# Patient Record
Sex: Male | Born: 1975 | Race: Black or African American | Hispanic: No | Marital: Single | Smoking: Former smoker
Health system: Southern US, Community
[De-identification: ages and names within clinical notes are randomized; demographics above are authoritative.]

## PROBLEM LIST (undated history)

## (undated) ENCOUNTER — Emergency Department (HOSPITAL_BASED_OUTPATIENT_CLINIC_OR_DEPARTMENT_OTHER): Admission: EM | Payer: Managed Care, Other (non HMO) | Source: Home / Self Care

## (undated) DIAGNOSIS — F419 Anxiety disorder, unspecified: Secondary | ICD-10-CM

---

## 2019-04-25 ENCOUNTER — Other Ambulatory Visit: Payer: Self-pay

## 2019-04-25 DIAGNOSIS — Z20822 Contact with and (suspected) exposure to covid-19: Secondary | ICD-10-CM

## 2019-04-26 LAB — NOVEL CORONAVIRUS, NAA: SARS-CoV-2, NAA: NOT DETECTED

## 2019-08-10 ENCOUNTER — Other Ambulatory Visit: Payer: Self-pay

## 2019-08-10 DIAGNOSIS — Z20822 Contact with and (suspected) exposure to covid-19: Secondary | ICD-10-CM

## 2019-08-12 LAB — NOVEL CORONAVIRUS, NAA: SARS-CoV-2, NAA: NOT DETECTED

## 2020-01-28 ENCOUNTER — Other Ambulatory Visit: Payer: Self-pay | Admitting: Family Medicine

## 2020-01-28 ENCOUNTER — Other Ambulatory Visit: Payer: Self-pay

## 2020-01-28 ENCOUNTER — Ambulatory Visit: Payer: Self-pay

## 2020-01-28 DIAGNOSIS — Z0000 Encounter for general adult medical examination without abnormal findings: Secondary | ICD-10-CM

## 2021-04-07 ENCOUNTER — Encounter (HOSPITAL_COMMUNITY): Payer: Self-pay

## 2021-04-07 ENCOUNTER — Emergency Department (HOSPITAL_BASED_OUTPATIENT_CLINIC_OR_DEPARTMENT_OTHER): Payer: 59 | Admitting: Radiology

## 2021-04-07 ENCOUNTER — Encounter (HOSPITAL_BASED_OUTPATIENT_CLINIC_OR_DEPARTMENT_OTHER): Payer: Self-pay | Admitting: Emergency Medicine

## 2021-04-07 ENCOUNTER — Emergency Department (HOSPITAL_BASED_OUTPATIENT_CLINIC_OR_DEPARTMENT_OTHER)
Admission: EM | Admit: 2021-04-07 | Discharge: 2021-04-07 | Disposition: A | Discharge status: Home / Self Care | Payer: 59 | Attending: Emergency Medicine | Admitting: Emergency Medicine

## 2021-04-07 ENCOUNTER — Emergency Department (HOSPITAL_BASED_OUTPATIENT_CLINIC_OR_DEPARTMENT_OTHER): Payer: 59

## 2021-04-07 ENCOUNTER — Other Ambulatory Visit: Payer: Self-pay

## 2021-04-07 ENCOUNTER — Ambulatory Visit (HOSPITAL_COMMUNITY)
Admission: EM | Admit: 2021-04-07 | Discharge: 2021-04-07 | Disposition: A | Discharge status: Home / Self Care | Payer: 59 | Attending: Internal Medicine | Admitting: Internal Medicine

## 2021-04-07 DIAGNOSIS — R0600 Dyspnea, unspecified: Secondary | ICD-10-CM

## 2021-04-07 DIAGNOSIS — R079 Chest pain, unspecified: Secondary | ICD-10-CM | POA: Diagnosis not present

## 2021-04-07 DIAGNOSIS — Z7982 Long term (current) use of aspirin: Secondary | ICD-10-CM | POA: Diagnosis not present

## 2021-04-07 DIAGNOSIS — R0602 Shortness of breath: Secondary | ICD-10-CM

## 2021-04-07 DIAGNOSIS — Z20822 Contact with and (suspected) exposure to covid-19: Secondary | ICD-10-CM | POA: Diagnosis not present

## 2021-04-07 DIAGNOSIS — Z87891 Personal history of nicotine dependence: Secondary | ICD-10-CM | POA: Diagnosis not present

## 2021-04-07 DIAGNOSIS — R0789 Other chest pain: Secondary | ICD-10-CM | POA: Diagnosis present

## 2021-04-07 DIAGNOSIS — R091 Pleurisy: Secondary | ICD-10-CM

## 2021-04-07 DIAGNOSIS — R072 Precordial pain: Secondary | ICD-10-CM

## 2021-04-07 HISTORY — DX: Anxiety disorder, unspecified: F41.9

## 2021-04-07 LAB — BASIC METABOLIC PANEL
Anion gap: 12 (ref 5–15)
BUN: 8 mg/dL (ref 6–20)
CO2: 22 mmol/L (ref 22–32)
Calcium: 9.3 mg/dL (ref 8.9–10.3)
Chloride: 102 mmol/L (ref 98–111)
Creatinine, Ser: 0.85 mg/dL (ref 0.61–1.24)
GFR, Estimated: >60 mL/min (ref >60)
Glucose, Bld: 121 mg/dL — ABNORMAL HIGH (ref 70–99)
Potassium: 4 mmol/L (ref 3.5–5.1)
Sodium: 136 mmol/L (ref 135–145)

## 2021-04-07 LAB — CBC
HCT: 43.4 % (ref 39.0–52.0)
Hemoglobin: 14.2 g/dL (ref 13.0–17.0)
MCH: 25.1 pg — ABNORMAL LOW (ref 26.0–34.0)
MCHC: 32.7 g/dL (ref 30.0–36.0)
MCV: 76.7 fL — ABNORMAL LOW (ref 80.0–100.0)
Platelets: 279 10*3/uL (ref 150–400)
RBC: 5.66 MIL/uL (ref 4.22–5.81)
RDW: 17.3 % — ABNORMAL HIGH (ref 11.5–15.5)
WBC: 6.7 10*3/uL (ref 4.0–10.5)
nRBC: 0.3 % — ABNORMAL HIGH (ref 0.0–0.2)

## 2021-04-07 LAB — RESP PANEL BY RT-PCR (FLU A&B, COVID) ARPGX2
Influenza A by PCR: NEGATIVE
Influenza B by PCR: NEGATIVE
SARS Coronavirus 2 by RT PCR: NEGATIVE

## 2021-04-07 LAB — HEPATIC FUNCTION PANEL
ALT: 39 U/L (ref 0–44)
AST: 43 U/L — ABNORMAL HIGH (ref 15–41)
Albumin: 4.4 g/dL (ref 3.5–5.0)
Alkaline Phosphatase: 52 U/L (ref 38–126)
Bilirubin, Direct: 0.2 mg/dL (ref 0.0–0.2)
Indirect Bilirubin: 0.6 mg/dL (ref 0.3–0.9)
Total Bilirubin: 0.8 mg/dL (ref 0.3–1.2)
Total Protein: 6.7 g/dL (ref 6.5–8.1)

## 2021-04-07 LAB — LIPASE, BLOOD: Lipase: 35 U/L (ref 11–51)

## 2021-04-07 LAB — TROPONIN I (HIGH SENSITIVITY): Troponin I (High Sensitivity): 3 ng/L (ref <18)

## 2021-04-07 MED ORDER — PANTOPRAZOLE SODIUM 20 MG PO TBEC: 20.0000 mg | DELAYED_RELEASE_TABLET | Freq: Every day | ORAL | 0 refills | Status: DC

## 2021-04-07 MED ORDER — SODIUM CHLORIDE 0.9 % IV SOLN: Freq: Once | INTRAVENOUS | Status: AC

## 2021-04-07 MED ORDER — IOHEXOL 350 MG/ML SOLN
75.0000 mL | Freq: Once | INTRAVENOUS | Status: AC | PRN
  Administered 2021-04-07: 75 mL via INTRAVENOUS

## 2021-04-07 MED ORDER — PANTOPRAZOLE SODIUM 40 MG IV SOLR
40.0000 mg | Freq: Once | INTRAVENOUS | Status: AC
  Administered 2021-04-07: 40 mg via INTRAVENOUS
  Filled 2021-04-07: qty 40

## 2021-04-07 MED ORDER — ASPIRIN EC 81 MG PO TBEC: 81.0000 mg | DELAYED_RELEASE_TABLET | Freq: Every day | ORAL | 1 refills | Status: DC

## 2021-04-07 NOTE — ED Provider Notes (Signed)
[facility]    CSN: [id number] Arrival date & time: 04/07/21  1146      History   Chief Complaint Chief Complaint  Patient presents with   Shortness of Breath   Chest Pain    HPI Russell Dawson is a 45 y.o. male.   Patient presents today with a 3 to 4-day history of substernal chest pain.  Reports pain has been constant since onset and is rated 5/6 on a 0-10 pain scale, described as a discomfort, worse with ascending stairs or wearing a mask, no alleviating factors identified.  He reports associated shortness of breath but denies nausea, vomiting, diaphoresis, dizziness, syncope, radiating pain.  He denies history of heart disease or previous episodes of similar symptoms.  He is a former smoker but quit with onset of symptoms.  He denies history of diabetes, hypertension, hyperlipidemia.  He does have a family history of heart disease in his mother.  He has a history of anxiety and does have a history of panic attacks but states typically the symptoms resolve very quickly after removing himself from a stressful situation current symptoms have been persistent since onset.  He has not tried any over-the-counter medication for symptom management.  He does not have a primary care provider.  He is unsure if shortness of breath is related to chest pain or something else as he does have a history of snoring and has been waking up recently with his mouth being very dry.  He denies formal diagnosis of sleep apnea but is interested in establishing with a primary care provider now that he has insurance to investigate his health.    Past Medical History:  Diagnosis Date   Anxiety     There are no problems to display for this patient.   History reviewed. No pertinent surgical history.     Home Medications    Prior to Admission medications   Not on File    Family History Family History  Problem Relation Age of Onset   Heart failure Mother    Diabetes Mother    Tuberculosis  Mother    Dementia Father     Social History Social History   Tobacco Use   Smoking status: Former    Types: Cigarettes    Quit date: 04/03/2014    Years since quitting: 7.0   Smokeless tobacco: Never  Vaping Use   Vaping Use: Never used  Substance Use Topics   Alcohol use: Yes   Drug use: Never     Allergies   Patient has no known allergies.   Review of Systems Review of Systems  Constitutional:  Positive for fatigue. Negative for activity change, appetite change and fever.  Eyes:  Negative for visual disturbance.  Respiratory:  Positive for shortness of breath. Negative for cough.   Cardiovascular:  Positive for chest pain. Negative for palpitations and leg swelling.  Gastrointestinal:  Negative for abdominal pain, diarrhea, nausea and vomiting.  Neurological:  Negative for dizziness, weakness, light-headedness, numbness and headaches.    Physical Exam Triage Vital Signs ED Triage Vitals  Enc Vitals Group     BP 04/07/21 1206 133/82     Pulse Rate 04/07/21 1206 95     Resp 04/07/21 1206 16     Temp 04/07/21 1206 98.5 F (36.9 C)     Temp Source 04/07/21 1206 Oral     SpO2 04/07/21 1206 98 %     Weight --      Height --  Head Circumference --      Peak Flow --      Pain Score 04/07/21 1212 0     Pain Loc --      Pain Edu? --      Excl. in GC? --    No data found.  Updated Vital Signs BP 133/82 (BP Location: Right Arm)   Pulse 95   Temp 98.5 F (36.9 C) (Oral)   Resp 16   SpO2 98%   Visual Acuity Right Eye Distance:   Left Eye Distance:   Bilateral Distance:    Right Eye Near:   Left Eye Near:    Bilateral Near:     Physical Exam Vitals reviewed.  Constitutional:      General: He is awake.     Appearance: Normal appearance. He is normal weight. He is not ill-appearing.     Comments: Very pleasant male appears stated age in no acute distress sitting comfortably in exam room  HENT:     Head: Normocephalic and atraumatic.   Cardiovascular:     Rate and Rhythm: Normal rate and regular rhythm.     Heart sounds: Normal heart sounds, S1 normal and S2 normal. No murmur heard. Pulmonary:     Effort: Pulmonary effort is normal.     Breath sounds: Normal breath sounds. No stridor. No wheezing, rhonchi or rales.     Comments: Clear to auscultation bilaterally Chest:     Chest wall: No deformity, swelling or tenderness.  Abdominal:     General: Bowel sounds are normal.     Palpations: Abdomen is soft.     Tenderness: There is no abdominal tenderness.  Musculoskeletal:     Cervical back: Normal range of motion and neck supple.  Neurological:     Mental Status: He is alert.  Psychiatric:        Behavior: Behavior is cooperative.     UC Treatments / Results  Labs (all labs ordered are listed, but only abnormal results are displayed) Labs Reviewed - No data to display  EKG   Radiology No results found.  Procedures Procedures (including critical care time)  Medications Ordered in UC Medications - No data to display  Initial Impression / Assessment and Plan / UC Course  I have reviewed the triage vital signs and the nursing notes.  Pertinent labs & imaging results that were available during my care of the patient were reviewed by me and considered in my medical decision making (see chart for details).      EKG obtained showed normal sinus rhythm with ventricular rate of 100 bpm without ischemic changes; no previous to compare.  Vital signs are appropriate today.  Given chest pain and associated shortness of breath discussed the patient would need to go to the emergency room for further evaluation and management to consider troponins to rule out cardiac etiology.  Patient was reluctant but ultimately agreed to go to the emergency room for further evaluation.  He declined EMS and vital signs were stable at the time of discharge so patient is safe for private transport.  He will go directly to emergency  room following visit for further evaluation and management.  Final Clinical Impressions(s) / UC Diagnoses   Final diagnoses:  Chest pain, unspecified type  Shortness of breath     Discharge Instructions      Please go to the hospital for further evaluation and to consider lab work to monitor your heart since you are having both chest  pain and shortness of breath.  If you have any sudden worsening of your chest pain or feel you are going to pass out while going to the hospital he should stop and call 911.     ED Prescriptions   None    PDMP not reviewed this encounter.   Jeani Hawking, PA-C 04/07/21 1305

## 2021-04-07 NOTE — ED Triage Notes (Signed)
Patient presents to Urgent Care with complaints of SOB and chest pain x 3-4 days ago. He states he has a hx of anxiety does not take medications for. He states he has been under stress with mother being hospitalized.

## 2021-04-07 NOTE — ED Provider Notes (Signed)
[facility] EMERGENCY DEPT Provider Note   CSN: [id number] Arrival date & time: 04/07/21  1413     History Chief Complaint  Patient presents with   Chest Pain    Russell Dawson is a 45 y.o. male.  HPI Patient is sent from urgent care for further evaluation of chest pain.  Patient reports has had a central chest pain.  It is aching in quality.  He reports it is somewhat worse with deep inspiration and with exertion.  He reports he does feel somewhat short of breath and like he has to work a little harder to take a deep breath patient reports he first experienced this 4 days ago.  Reports prior to that he had no symptoms.  Patient reports that he did have a recent car trip to [place].  He drove about 6 and half hours.  Reports he did stop for bathroom breaks.  He reports that the chest pain started just about after his trip.  No fever no chills.  No productive cough or hemoptysis.  No syncope or near syncope.  Family history: Patient's mother recently diagnosed with congestive heart failure.  No history of sudden death or coronary artery disease at early age.  Patient does not have any siblings.  Patient reports he was previously a smoker but quit 4 days ago when the symptoms started.  He denies any other drug use. HPI: A 45 year old patient presents for evaluation of chest pain. Initial onset of pain was more than 6 hours ago. The patient's chest pain is described as heaviness/pressure/tightness and is worse with exertion. The patient's chest pain is not middle- or left-sided, is not well-localized, is not sharp and does not radiate to the arms/jaw/neck. The patient does not complain of nausea and denies diaphoresis. The patient has smoked in the past 90 days. The patient has no history of stroke, has no history of peripheral artery disease, denies any history of treated diabetes, has no relevant family history of coronary artery disease (first degree relative at less than age 29), is  not hypertensive, has no history of hypercholesterolemia and does not have an elevated BMI (>=30).   Past Medical History:  Diagnosis Date   Anxiety     There are no problems to display for this patient.   History reviewed. No pertinent surgical history.     Family History  Problem Relation Age of Onset   Heart failure Mother    Diabetes Mother    Tuberculosis Mother    Dementia Father     Social History   Tobacco Use   Smoking status: Former    Types: Cigarettes    Quit date: 04/03/2014    Years since quitting: 7.0   Smokeless tobacco: Never  Vaping Use   Vaping Use: Never used  Substance Use Topics   Alcohol use: Yes   Drug use: Never    Home Medications Prior to Admission medications   Medication Sig Start Date End Date Taking? Authorizing Provider  aspirin EC 81 MG tablet Take 1 tablet (81 mg total) by mouth daily. Swallow whole. 04/07/21  Yes Arby Barrette, MD  pantoprazole (PROTONIX) 20 MG tablet Take 1 tablet (20 mg total) by mouth daily. 04/07/21  Yes Arby Barrette, MD    Allergies    Patient has no known allergies.  Review of Systems   Review of Systems 10 systems reviewed and negative except as per HPI Physical Exam Updated Vital Signs BP 119/85   Pulse 88  Temp 97.7 F (36.5 C) (Oral)   Resp 20   Ht 5\' 11"  (1.803 m)   Wt 95.7 kg   SpO2 100%   BMI 29.43 kg/m   Physical Exam Constitutional:      Appearance: Normal appearance.  HENT:     Mouth/Throat:     Mouth: Mucous membranes are moist.     Pharynx: Oropharynx is clear.  Eyes:     Extraocular Movements: Extraocular movements intact.  Cardiovascular:     Rate and Rhythm: Normal rate and regular rhythm.  Pulmonary:     Effort: Pulmonary effort is normal.     Breath sounds: Normal breath sounds.  Abdominal:     General: There is no distension.     Palpations: Abdomen is soft.     Tenderness: There is no abdominal tenderness. There is no guarding.  Musculoskeletal:         General: No swelling or tenderness. Normal range of motion.     Right lower leg: No edema.     Left lower leg: No edema.  Skin:    General: Skin is warm and dry.  Neurological:     General: No focal deficit present.     Mental Status: He is alert and oriented to person, place, and time.     Motor: No weakness.     Coordination: Coordination normal.  Psychiatric:        Mood and Affect: Mood normal.    ED Results / Procedures / Treatments   Labs (all labs ordered are listed, but only abnormal results are displayed) Labs Reviewed  BASIC METABOLIC PANEL - Abnormal; Notable for the following components:      Result Value   Glucose, Bld 121 (*)    All other components within normal limits  CBC - Abnormal; Notable for the following components:   MCV 76.7 (*)    MCH 25.1 (*)    RDW 17.3 (*)    nRBC 0.3 (*)    All other components within normal limits  HEPATIC FUNCTION PANEL - Abnormal; Notable for the following components:   AST 43 (*)    All other components within normal limits  RESP PANEL BY RT-PCR (FLU A&B, COVID) ARPGX2  LIPASE, BLOOD  TROPONIN I (HIGH SENSITIVITY)    EKG EKG Interpretation  Date/Time:  Friday April 07 2021 14:21:37 EDT Ventricular Rate:  90 PR Interval:  142 QRS Duration: 92 QT Interval:  350 QTC Calculation: 428 R Axis:   4 Text Interpretation: Normal sinus rhythm Minimal voltage criteria for LVH, may be normal variant ( R in aVL ) Nonspecific T wave abnormality Abnormal ECG agree, no old comparison Confirmed by 07-10-1994 [phone]) on 04/07/2021 6:30:46 PM  Radiology DG Chest 2 View  Result Date: 04/07/2021 CLINICAL DATA:  Chest pain and short of breath EXAM: CHEST - 2 VIEW COMPARISON:  01/28/2020 FINDINGS: The heart size and mediastinal contours are within normal limits. Both lungs are clear. The visualized skeletal structures are unremarkable. IMPRESSION: No active cardiopulmonary disease. Electronically Signed   By: 03/29/2020 M.D.   On:  04/07/2021 15:05   CT Angio Chest PE W/Cm &/Or Wo Cm  Result Date: 04/07/2021 CLINICAL DATA:  Concern for pulmonary embolism. EXAM: CT ANGIOGRAPHY CHEST WITH CONTRAST TECHNIQUE: Multidetector CT imaging of the chest was performed using the standard protocol during bolus administration of intravenous contrast. Multiplanar CT image reconstructions and MIPs were obtained to evaluate the vascular anatomy. CONTRAST:  52mL OMNIPAQUE IOHEXOL 350 MG/ML  SOLN COMPARISON:  None. FINDINGS: Cardiovascular: No filling defects within the pulmonary arteries to suggest acute pulmonary embolism. Mediastinum/Nodes: No axillary or supraclavicular adenopathy. No mediastinal or hilar adenopathy. No pericardial fluid. Esophagus normal. Lungs/Pleura: No pulmonary infarction. No pneumonia. No pleural fluid or pneumothorax. Upper Abdomen: Limited view of the liver, kidneys, pancreas are unremarkable. Normal adrenal glands. Musculoskeletal: No aggressive osseous lesion Review of the MIP images confirms the above findings. Electronically Signed   By: Genevive Bi M.D.   On: 04/07/2021 19:40    Procedures Procedures   Medications Ordered in ED Medications  0.9 %  sodium chloride infusion ( Intravenous New Bag/Given 04/07/21 1914)  iohexol (OMNIPAQUE) 350 MG/ML injection 75 mL (75 mLs Intravenous Contrast Given 04/07/21 1919)  pantoprazole (PROTONIX) injection 40 mg (40 mg Intravenous Given 04/07/21 1953)    ED Course  I have reviewed the triage vital signs and the nursing notes.  Pertinent labs & imaging results that were available during my care of the patient were reviewed by me and considered in my medical decision making (see chart for details).     MDM Rules/Calculators/A&P HEAR Score: 3                        Patient presents as outlined.  He did have a recent car trip with onset of pleuritic quality chest pain just after that event.  CT scan has ruled out pulmonary embolus.  Patient does not have hypoxia COVID  is negative.  Patient's heart score is 3.  Troponin negative.  At this time, plan will be for initiating Protonix and daily enteric-coated aspirin.  Patient is counseled to follow-up with cardiology.  Strict return precautions provided.  Patient will also be getting established with PCP. Final Clinical Impression(s) / ED Diagnoses Final diagnoses:  Precordial pain  Dyspnea, unspecified type  Pleurisy    Rx / DC Orders ED Discharge Orders          Ordered    pantoprazole (PROTONIX) 20 MG tablet  Daily        04/07/21 2011    aspirin EC 81 MG tablet  Daily        04/07/21 2011             Arby Barrette, MD 04/07/21 2022

## 2021-04-07 NOTE — ED Notes (Signed)
Patient is being discharged from the Urgent Care and sent to the Emergency Department via POV . Per Dorann Ou PA, patient is in need of higher level of care due to Chest Pain and SOB. Patient is aware and verbalizes understanding of plan of care.  Vitals:   04/07/21 1206  BP: 133/82  Pulse: 95  Resp: 16  Temp: 98.5 F (36.9 C)  SpO2: 98%

## 2021-04-07 NOTE — ED Notes (Signed)
Patient transported to CT 

## 2021-04-07 NOTE — Discharge Instructions (Addendum)
Please go to the hospital for further evaluation and to consider lab work to monitor your heart since you are having both chest pain and shortness of breath.  If you have any sudden worsening of your chest pain or feel you are going to pass out while going to the hospital he should stop and call 911.

## 2021-04-07 NOTE — ED Triage Notes (Signed)
Chest tightness and SOB x 4 days. Sent from UC.

## 2021-04-07 NOTE — Discharge Instructions (Addendum)
1.  Call [facility] for a follow-up appointment as soon as possible.  You should also have a family doctor.  Use the referral number your discharge instructions to help find a physician. 2.  Take Protonix daily as prescribed.  Also take a 81 mg aspirin daily as prescribed until you have been seen in follow-up for further evaluation and testing. 3.  You had quit smoking recently.  That is very helpful for your health and trying to minimize your risk factors for heart disease.  Do not start smoking again. 4.  Return to the emergency department immediately if you have recurrence of pain, other concerning symptoms such as lightheadedness, feeling to pass out unusual nausea, sweating fatigue or other concerning symptoms.

## 2021-04-07 NOTE — ED Notes (Signed)
Pt verbalizes understanding of discharge instructions. Opportunity for questioning and answers were provided. Armand removed by staff, pt discharged from ED to home. Educated to f/u with primary care.

## 2021-06-16 ENCOUNTER — Encounter (HOSPITAL_BASED_OUTPATIENT_CLINIC_OR_DEPARTMENT_OTHER): Payer: Self-pay | Admitting: *Deleted

## 2021-06-16 ENCOUNTER — Emergency Department (HOSPITAL_BASED_OUTPATIENT_CLINIC_OR_DEPARTMENT_OTHER)
Admission: EM | Admit: 2021-06-16 | Discharge: 2021-06-16 | Disposition: A | Discharge status: Home / Self Care | Payer: 59 | Attending: Emergency Medicine | Admitting: Emergency Medicine

## 2021-06-16 ENCOUNTER — Emergency Department (HOSPITAL_BASED_OUTPATIENT_CLINIC_OR_DEPARTMENT_OTHER): Payer: 59 | Admitting: Radiology

## 2021-06-16 ENCOUNTER — Other Ambulatory Visit: Payer: Self-pay

## 2021-06-16 DIAGNOSIS — M25551 Pain in right hip: Secondary | ICD-10-CM

## 2021-06-16 DIAGNOSIS — Z87891 Personal history of nicotine dependence: Secondary | ICD-10-CM | POA: Diagnosis not present

## 2021-06-16 DIAGNOSIS — M79604 Pain in right leg: Secondary | ICD-10-CM | POA: Diagnosis not present

## 2021-06-16 MED ORDER — IBUPROFEN 400 MG PO TABS
600.0000 mg | ORAL_TABLET | Freq: Once | ORAL | Status: AC
  Administered 2021-06-16: 600 mg via ORAL
  Filled 2021-06-16: qty 1

## 2021-06-16 MED ORDER — ACETAMINOPHEN 325 MG PO TABS
650.0000 mg | ORAL_TABLET | Freq: Once | ORAL | Status: AC
  Administered 2021-06-16: 325 mg via ORAL
  Filled 2021-06-16: qty 2

## 2021-06-16 NOTE — ED Notes (Signed)
Patient lying in bed states pain in right hurts. Rates a 7. Dr. Audley Hose aware medications ordered. Reviewed d/c papers with patient with understanding.

## 2021-06-16 NOTE — Discharge Instructions (Addendum)
Call your primary care doctor or specialist as discussed in the next 2-3 days.   Return immediately back to the ER if:  Your symptoms worsen within the next 12-24 hours. You develop new symptoms such as new fevers, persistent vomiting, new pain, shortness of breath, or new weakness or numbness, or if you have any other concerns.  

## 2021-06-16 NOTE — ED Provider Notes (Signed)
[facility] EMERGENC[facility]PT Provider Note   CSN: [id number] Arrival date & time: 06/16/21  1246     History Chief Complaint  Patient presents with   Hip Pain   Leg Pain    Russell Dawson is a 45 y.o. male.  Chief complaint of right hip pain.  Symptoms ongoing for 2 to 3 weeks.  Describes a sharp and aching pain in the right hip worse when he puts weight on it a certain way or turns on a certain way.  He denies any specific fall or trauma, does recall playing basketball about 3 weeks ago, but did not have immediate pain after playing.  Denies fevers or cough no vomiting or diarrhea pain radiates down his leg on the backside to his knee.      Past Medical History:  Diagnosis Date   Anxiety     There are no problems to display for this patient.   History reviewed. No pertinent surgical history.     Family History  Problem Relation Age of Onset   Heart failure Mother    Diabetes Mother    Tuberculosis Mother    Dementia Father     Social History   Tobacco Use   Smoking status: Former    Types: Cigarettes    Quit date: 04/03/2014    Years since quitting: 7.2   Smokeless tobacco: Never  Vaping Use   Vaping Use: Never used  Substance Use Topics   Alcohol use: Yes    Comment: occasionally   Drug use: Never    Home Medications Prior to Admission medications   Not on File    Allergies    Patient has no known allergies.  Review of Systems   Review of Systems  Constitutional:  Negative for fever.  HENT:  Negative for ear pain and sore throat.   Eyes:  Negative for pain.  Respiratory:  Negative for cough.   Cardiovascular:  Negative for chest pain.  Gastrointestinal:  Negative for abdominal pain.  Genitourinary:  Negative for flank pain.  Musculoskeletal:  Negative for back pain.  Skin:  Negative for color change and rash.  Neurological:  Negative for syncope.  All other systems reviewed and are negative.  Physical Exam Updated Vital  Signs BP (!) 143/100 (BP Location: Right Arm)   Pulse (!) 118   Temp 98.6 F (37 C)   Resp 16   Ht 5\' 11"  (1.803 m)   Wt 97.5 kg   SpO2 98%   BMI 29.99 kg/m   Physical Exam Constitutional:      Appearance: He is well-developed.  HENT:     Head: Normocephalic.     Nose: Nose normal.  Eyes:     Extraocular Movements: Extraocular movements intact.  Cardiovascular:     Rate and Rhythm: Normal rate.  Pulmonary:     Effort: Pulmonary effort is normal.  Musculoskeletal:     Right lower leg: No edema.     Left lower leg: No edema.     Comments: Right lower extremity normal range of motion of the hips and knees and ankle.  No pain elicited on internal rotation or external rotation of the hip or axial loading of the hip. Neurovascularly intact lower extremity distal pulses are 2+ dorsalis pedis and posterior tibialis.  Compartments are otherwise soft.  Patient has a nonantalgic gait on exam here.  Skin:    Coloration: Skin is not jaundiced.  Neurological:     Mental Status: He is alert.  Mental status is at baseline.    ED Results / Procedures / Treatments   Labs (all labs ordered are listed, but only abnormal results are displayed) Labs Reviewed - No data to display  EKG None  Radiology DG Hip Unilat W or Wo Pelvis 2-3 Views Right  Result Date: 06/16/2021 CLINICAL DATA:  Right hip and groin pain for 2 weeks EXAM: DG HIP (WITH OR WITHOUT PELVIS) 2-3V RIGHT COMPARISON:  None. FINDINGS: There is no evidence of hip fracture or dislocation. No significant arthropathy. No focal bony abnormality. No soft tissue abnormality. IMPRESSION: Negative. Electronically Signed   By: Duanne Guess D.O.   On: 06/16/2021 14:04    Procedures Procedures   Medications Ordered in ED Medications - No data to display  ED Course  I have reviewed the triage vital signs and the nursing notes.  Pertinent labs & imaging results that were available during my care of the patient were reviewed by  me and considered in my medical decision making (see chart for details).    MDM Rules/Calculators/A&P                           No clear cause of patient's pain elicited.  Differential included septic joint, fracture, dislocation among others.  X-rays are unremarkable.  Advising continue Tylenol and Motrin at home.  Advised outpatient follow-up with orthopedic surgery within the week.  Advised immediate return for fevers worsening pain or any additional concerns.  Final Clinical Impression(s) / ED Diagnoses Final diagnoses:  Hip pain, right    Rx / DC Orders ED Discharge Orders     None        Cheryll Cockayne, MD 06/16/21 1623

## 2021-06-16 NOTE — ED Triage Notes (Signed)
Right hip pain radiating to his right leg for 2 weeks.  Patient can not remember any injury.  He did remember that he was playing basketball 3 weeks ago.

## 2021-06-16 NOTE — ED Provider Notes (Signed)
Emergency Medicine Provider Triage Evaluation Note  Russell Dawson , a 45 y.o. male  was evaluated in triage.  Pt complains of pain to right hip.  Pain has been present over the last 2 weeks.  Pain is now radiating through posterior right thigh.  Patient denies any known injuries.  Denies any swelling, erythema, rash.  Review of Systems  Positive: Arthralgia, myalgia Negative: Fever, chills, swelling, rash  Physical Exam  BP (!) 143/100 (BP Location: Right Arm)   Pulse (!) 118   Temp 98.6 F (37 C)   Resp 16   SpO2 98%  Gen:   Awake, no distress   Resp:  Normal effort  MSK:   Moves extremities without difficulty, tenderness to right hip Other:  +2 DP pulse bilaterally  Medical Decision Making  Medically screening exam initiated at 1:11 PM.  Appropriate orders placed.  Lyn Deemer was informed that the remainder of the evaluation will be completed by another provider, this initial triage assessment does not replace that evaluation, and the importance of remaining in the ED until their evaluation is complete.  The patient appears stable so that the remainder of the work up may be completed by another provider.      Haskel Schroeder, PA-C 06/16/21 1312    Sloan Leiter, DO 06/16/21 1932

## 2021-06-20 ENCOUNTER — Ambulatory Visit: Payer: 59 | Admitting: Family Medicine

## 2021-06-20 ENCOUNTER — Ambulatory Visit: Payer: Self-pay

## 2021-06-20 ENCOUNTER — Encounter: Payer: Self-pay | Admitting: Family Medicine

## 2021-06-20 VITALS — Ht 71.0 in | Wt 215.0 lb

## 2021-06-20 DIAGNOSIS — M65959 Unspecified synovitis and tenosynovitis, unspecified thigh: Secondary | ICD-10-CM

## 2021-06-20 DIAGNOSIS — M659 Synovitis and tenosynovitis, unspecified: Secondary | ICD-10-CM

## 2021-06-20 DIAGNOSIS — M24159 Other articular cartilage disorders, unspecified hip: Secondary | ICD-10-CM

## 2021-06-20 HISTORY — DX: Synovitis and tenosynovitis, unspecified: M65.9

## 2021-06-20 HISTORY — DX: Unspecified synovitis and tenosynovitis, unspecified thigh: M65.959

## 2021-06-20 MED ORDER — TRIAMCINOLONE ACETONIDE 40 MG/ML IJ SUSP
40.0000 mg | Freq: Once | INTRAMUSCULAR | Status: AC
  Administered 2021-06-20: 40 mg via INTRA_ARTICULAR

## 2021-06-20 NOTE — Patient Instructions (Signed)
Nice to meet you ?Please try ice  ?Please try the exercises   ?Please send me a message in MyChart with any questions or updates.  ?Please see me back in 3 weeks.  ? ?--Dr. Dola Lunsford ? ?

## 2021-06-20 NOTE — Assessment & Plan Note (Signed)
Concern for labral irritation given no traumatic history and ongoing pain with normal imaging. -Counseled on home exercise therapy and supportive care. -Injection today. -Could consider physical therapy

## 2021-06-20 NOTE — Progress Notes (Signed)
  Russell Dawson - 45 y.o. male MRN [id number]  Date of birth: 11/17/1975  SUBJECTIVE:  Including CC & ROS.  No chief complaint on file.   Russell Dawson is a 45 y.o. male that is presenting with acute right hip pain.  Has been ongoing for about 4 weeks.  Is walking with a limp currently..  Independent review of the right hip x-ray from 9/23 shows no acute changes.   Review of Systems See HPI   HISTORY: Past Medical, Surgical, Social, and Family History Reviewed & Updated per EMR.   Pertinent Historical Findings include:  Past Medical History:  Diagnosis Date   Anxiety     History reviewed. No pertinent surgical history.  Family History  Problem Relation Age of Onset   Heart failure Mother    Diabetes Mother    Tuberculosis Mother    Dementia Father     Social History   Socioeconomic History   Marital status: Single    Spouse name: Not on file   Number of children: Not on file   Years of education: Not on file   Highest education level: Not on file  Occupational History   Not on file  Tobacco Use   Smoking status: Former    Types: Cigarettes    Quit date: 04/03/2014    Years since quitting: 7.2   Smokeless tobacco: Never  Vaping Use   Vaping Use: Never used  Substance and Sexual Activity   Alcohol use: Yes    Comment: occasionally   Drug use: Never   Sexual activity: Not on file  Other Topics Concern   Not on file  Social History Narrative   Not on file   Social Determinants of Health   Financial Resource Strain: Not on file  Food Insecurity: Not on file  Transportation Needs: Not on file  Physical Activity: Not on file  Stress: Not on file  Social Connections: Not on file  Intimate Partner Violence: Not on file     PHYSICAL EXAM:  VS: Ht 5\' 11"  (1.803 m)   Wt 215 lb (97.5 kg)   BMI 29.99 kg/m  Physical Exam Gen: NAD, alert, cooperative with exam, well-appearing    Aspiration/Injection Procedure Note Russell Dawson [birth year]  Procedure:  Injection Indications: Right hip pain  Procedure Details Consent: Risks of procedure as well as the alternatives and risks of each were explained to the (patient/caregiver).  Consent for procedure obtained. Time Out: Verified patient identification, verified procedure, site/side was marked, verified correct patient position, special equipment/implants available, medications/allergies/relevent history reviewed, required imaging and test results available.  Performed.  The area was cleaned with iodine and alcohol swabs.    The right hip joint was injected using 3 cc of 1% lidocaine on a 22-gauge 3-1/2 inch needle.  The syringe was switched and a mixture containing 1 cc's of 40 mg Kenalog and 4 cc's of 0.25% bupivacaine was injected.  Ultrasound was used. Images were obtained in long views showing the injection.     A sterile dressing was applied.  Patient did tolerate procedure well.      ASSESSMENT & PLAN:   Labral tear of hip, degenerative Concern for labral irritation given no traumatic history and ongoing pain with normal imaging. -Counseled on home exercise therapy and supportive care. -Injection today. -Could consider physical therapy

## 2021-06-26 ENCOUNTER — Ambulatory Visit: Payer: 59 | Admitting: Family Medicine

## 2021-06-26 VITALS — Ht 71.0 in | Wt 215.0 lb

## 2021-06-26 DIAGNOSIS — M24159 Other articular cartilage disorders, unspecified hip: Secondary | ICD-10-CM | POA: Diagnosis not present

## 2021-06-26 MED ORDER — BACLOFEN 10 MG PO TABS: 10.0000 mg | ORAL_TABLET | Freq: Three times a day (TID) | ORAL | 0 refills | Status: DC

## 2021-06-26 MED ORDER — PREDNISONE 5 MG PO TABS: ORAL_TABLET | ORAL | 0 refills | Status: DC

## 2021-06-26 MED ORDER — HYDROCODONE-ACETAMINOPHEN 5-325 MG PO TABS: 1.0000 | ORAL_TABLET | Freq: Three times a day (TID) | ORAL | 0 refills | Status: DC | PRN

## 2021-06-26 MED ORDER — MELOXICAM 7.5 MG PO TABS: 7.5000 mg | ORAL_TABLET | Freq: Two times a day (BID) | ORAL | 1 refills | Status: DC | PRN

## 2021-06-26 NOTE — Patient Instructions (Signed)
Good to see you Please stop the ibuprofen and try the mobic. You can use this for mild to moderate pain. Please start this after the prednisone  Please use the norco only for severe pain and can't be taken while driving or at work.  I will call with the lab results from today   Please send me a message in MyChart with any questions or updates.  Please see me back in 4 weeks.   --Dr. Jordan Likes

## 2021-06-26 NOTE — Progress Notes (Signed)
  Russell Dawson - 45 y.o. male MRN [id number]  Date of birth: 1975-12-05  SUBJECTIVE:  Including CC & ROS.  No chief complaint on file.   Russell Dawson is a 45 y.o. male that is presenting with acute worsening of his right hip pain.  He had relief with a steroid injection for about a day or 2.  Pain is severe in nature and has been ongoing for over a month.  Pain is worse with any weightbearing or ambulation.    Review of Systems See HPI   HISTORY: Past Medical, Surgical, Social, and Family History Reviewed & Updated per EMR.   Pertinent Historical Findings include:  Past Medical History:  Diagnosis Date   Anxiety     No past surgical history on file.  Family History  Problem Relation Age of Onset   Heart failure Mother    Diabetes Mother    Tuberculosis Mother    Dementia Father     Social History   Socioeconomic History   Marital status: Single    Spouse name: Not on file   Number of children: Not on file   Years of education: Not on file   Highest education level: Not on file  Occupational History   Not on file  Tobacco Use   Smoking status: Former    Types: Cigarettes    Quit date: 04/03/2014    Years since quitting: 7.2   Smokeless tobacco: Never  Vaping Use   Vaping Use: Never used  Substance and Sexual Activity   Alcohol use: Yes    Comment: occasionally   Drug use: Never   Sexual activity: Not on file  Other Topics Concern   Not on file  Social History Narrative   Not on file   Social Determinants of Health   Financial Resource Strain: Not on file  Food Insecurity: Not on file  Transportation Needs: Not on file  Physical Activity: Not on file  Stress: Not on file  Social Connections: Not on file  Intimate Partner Violence: Not on file     PHYSICAL EXAM:  VS: Ht 5\' 11"  (1.803 m)   Wt 215 lb (97.5 kg)   BMI 29.99 kg/m  Physical Exam Gen: NAD, alert, cooperative with exam, well-appearing      ASSESSMENT & PLAN:   Labral tear of hip,  degenerative Acutely worsening.  Still seems joint related as he did get improvement with the steroid injection but only limited improvement of his pain. Less likely for infection. -Counseled on home exercise therapy and supportive care. -Meloxicam. -Baclofen. -Norco. -Provided work note. -CMP, CBC, sed rate, CRP -May need to pursue further imaging if symptoms worsen.

## 2021-06-26 NOTE — Assessment & Plan Note (Signed)
Acutely worsening.  Still seems joint related as he did get improvement with the steroid injection but only limited improvement of his pain. Less likely for infection. -Counseled on home exercise therapy and supportive care. -Meloxicam. -Baclofen. -Norco. -Provided work note. -CMP, CBC, sed rate, CRP -May need to pursue further imaging if symptoms worsen.

## 2021-06-27 LAB — CBC WITH DIFFERENTIAL
Basophils Absolute: 0 10*3/uL (ref 0.0–0.2)
Basos: 1 %
EOS (ABSOLUTE): 0 10*3/uL (ref 0.0–0.4)
Eos: 1 %
Hematocrit: 39.4 % (ref 37.5–51.0)
Hemoglobin: 13 g/dL (ref 13.0–17.7)
Immature Grans (Abs): 0 10*3/uL (ref 0.0–0.1)
Immature Granulocytes: 1 %
Lymphocytes Absolute: 3 10*3/uL (ref 0.7–3.1)
Lymphs: 51 %
MCH: 25.3 pg — ABNORMAL LOW (ref 26.6–33.0)
MCHC: 33 g/dL (ref 31.5–35.7)
MCV: 77 fL — ABNORMAL LOW (ref 79–97)
Monocytes Absolute: 0.5 10*3/uL (ref 0.1–0.9)
Monocytes: 8 %
Neutrophils Absolute: 2.2 10*3/uL (ref 1.4–7.0)
Neutrophils: 38 %
RBC: 5.14 x10E6/uL (ref 4.14–5.80)
RDW: 16.2 % — ABNORMAL HIGH (ref 11.6–15.4)
WBC: 5.8 10*3/uL (ref 3.4–10.8)

## 2021-06-27 LAB — COMPREHENSIVE METABOLIC PANEL
ALT: 49 IU/L — ABNORMAL HIGH (ref 0–44)
AST: 43 IU/L — ABNORMAL HIGH (ref 0–40)
Albumin/Globulin Ratio: 2.4 — ABNORMAL HIGH (ref 1.2–2.2)
Albumin: 4.8 g/dL (ref 4.0–5.0)
Alkaline Phosphatase: 95 IU/L (ref 44–121)
BUN/Creatinine Ratio: 16 (ref 9–20)
BUN: 13 mg/dL (ref 6–24)
Bilirubin Total: 0.5 mg/dL (ref 0.0–1.2)
CO2: 19 mmol/L — ABNORMAL LOW (ref 20–29)
Calcium: 9.4 mg/dL (ref 8.7–10.2)
Chloride: 101 mmol/L (ref 96–106)
Creatinine, Ser: 0.79 mg/dL (ref 0.76–1.27)
Globulin, Total: 2 g/dL (ref 1.5–4.5)
Glucose: 90 mg/dL (ref 70–99)
Potassium: 4.3 mmol/L (ref 3.5–5.2)
Sodium: 142 mmol/L (ref 134–144)
Total Protein: 6.8 g/dL (ref 6.0–8.5)
eGFR: 114 mL/min/{1.73_m2} (ref >59)

## 2021-06-27 LAB — C-REACTIVE PROTEIN: CRP: 1 mg/L (ref 0–10)

## 2021-06-27 LAB — SEDIMENTATION RATE: Sed Rate: 2 mm/hr (ref 0–15)

## 2021-06-28 ENCOUNTER — Telehealth: Payer: Self-pay | Admitting: Family Medicine

## 2021-06-28 NOTE — Telephone Encounter (Signed)
Left VM for patient. If he calls back please have him speak with a nurse/CMA and inform that his labs show no sign of inflammatory or infection.  He does have elevated liver enzymes which we will have to keep an eye on.   If any questions then please take the best time and phone number to call and I will try to call him back.   Myra Rude, MD [facility] 06/28/2021, 5:05 PM

## 2021-07-12 ENCOUNTER — Ambulatory Visit: Payer: 59 | Admitting: Family Medicine

## 2021-07-12 NOTE — Telephone Encounter (Signed)
Pt informed of below.    He states he is in bed, not feeling well. He c/o night sweats and swollen lymph nodes around his neck/shoulder. He states he completed the prednisone which seemed to help temporarily. He stopped taking baclofen, meloxicam and hydroco/apap.  He states his symptoms are better controlled with Ibuprofen 800 mg and 1 tylenol 500 mg. He feels like this is a infection and is requesting antibiotics and possibly more ibuprofen 800 mg. He states he has to be back at work on 07/17/21.

## 2021-07-12 NOTE — Telephone Encounter (Signed)
Left VM for patient. If he calls back please have him speak with a nurse/CMA and ask if his pain is worse or changed at all. I'd try to schedule a virtual visit so we can discuss.   If any questions then please take the best time and phone number to call and I will try to call him back.   Myra Rude, MD [facility] 07/12/2021, 1:43 PM

## 2021-07-12 NOTE — Telephone Encounter (Signed)
Virtual OV scheduled 07/13/21.

## 2021-07-13 ENCOUNTER — Telehealth (INDEPENDENT_AMBULATORY_CARE_PROVIDER_SITE_OTHER): Payer: 59 | Admitting: Family Medicine

## 2021-07-13 DIAGNOSIS — M659 Synovitis and tenosynovitis, unspecified: Secondary | ICD-10-CM

## 2021-07-13 NOTE — Assessment & Plan Note (Signed)
Pain is severe and worsening. Xray and lab work has been unrevealing. Concern for infection vs inflammatory source given intensity of pain with no inciting event.  - counseled on supportive care  - MRI of hip to evaluate for joint infection

## 2021-07-13 NOTE — Progress Notes (Signed)
Virtual Visit via Video Note  I connected with Russell Dawson on 07/13/21 at  8:00 AM EDT by a video enabled telemedicine application and verified that I am speaking with the correct person using two identifiers.  Location: Patient: home Provider: office   I discussed the limitations of evaluation and management by telemedicine and the availability of in person appointments. The patient expressed understanding and agreed to proceed.  History of Present Illness:  Russell Dawson is a 45 yo M that is presenting with acute worsening of his right hip pain. He has been experiencing severe pain at the hip with night sweats and swollen lymph nodes.    Observations/Objective:   Assessment and Plan:  Synovitis of right hip:  Pain is severe and worsening. Xray and lab work has been unrevealing. Concern for infection vs inflammatory source given intensity of pain with no inciting event.  - counseled on supportive care  - MRI of hip to evaluate for joint infection   Follow Up Instructions:    I discussed the assessment and treatment plan with the patient. The patient was provided an opportunity to ask questions and all were answered. The patient agreed with the plan and demonstrated an understanding of the instructions.   The patient was advised to call back or seek an in-person evaluation if the symptoms worsen or if the condition fails to improve as anticipated.    Clare Gandy, MD

## 2021-07-24 ENCOUNTER — Ambulatory Visit: Payer: 59 | Admitting: Family Medicine

## 2021-09-14 ENCOUNTER — Encounter (HOSPITAL_COMMUNITY): Payer: Self-pay | Admitting: Emergency Medicine

## 2021-09-14 ENCOUNTER — Ambulatory Visit (HOSPITAL_COMMUNITY)
Admission: EM | Admit: 2021-09-14 | Discharge: 2021-09-14 | Disposition: A | Discharge status: Home / Self Care | Payer: Managed Care, Other (non HMO) | Attending: Emergency Medicine | Admitting: Emergency Medicine

## 2021-09-14 ENCOUNTER — Other Ambulatory Visit: Payer: Self-pay

## 2021-09-14 DIAGNOSIS — B9689 Other specified bacterial agents as the cause of diseases classified elsewhere: Secondary | ICD-10-CM

## 2021-09-14 DIAGNOSIS — H109 Unspecified conjunctivitis: Secondary | ICD-10-CM | POA: Diagnosis not present

## 2021-09-14 DIAGNOSIS — L731 Pseudofolliculitis barbae: Secondary | ICD-10-CM | POA: Diagnosis not present

## 2021-09-14 DIAGNOSIS — Z113 Encounter for screening for infections with a predominantly sexual mode of transmission: Secondary | ICD-10-CM | POA: Diagnosis not present

## 2021-09-14 MED ORDER — CEPHALEXIN 500 MG PO CAPS: 500.0000 mg | ORAL_CAPSULE | Freq: Two times a day (BID) | ORAL | 0 refills | Status: AC

## 2021-09-14 MED ORDER — POLYMYXIN B-TRIMETHOPRIM 10000-0.1 UNIT/ML-% OP SOLN: 1.0000 [drp] | OPHTHALMIC | 0 refills | Status: DC

## 2021-09-14 NOTE — Discharge Instructions (Addendum)
Today you being treated for bacterial conjunctivitis.   Place one drop of polytrim into the effected eye every 4 hours while awake for 7 days. If the other eye starts to have symptoms you may use medication in it as well. Do not allow tip of dropper to touch eye. May use cool compress for comfort and to remove discharge if present. Pat the eye, do not wipe.  Do not rub eyes, this may cause more irritation.  May use benadryl as needed to help if itching present.  If symptoms persist after use of medication, please follow up at Urgent Care or with ophthalmologist (eye doctor)   Take keflex twice a day for 5 day to cover for infection   Hold warm-hot compresses to affected area at least 4 times a day, this helps to facilitate draining, the more the better  Please return for evaluation for increased swelling, increased tenderness or pain, non healing site, non draining site, you begin to have fever or chills   We reviewed the etiology of recurrent abscesses of skin.  Skin abscesses are collections of pus within the dermis and deeper skin tissues. Skin abscesses manifest as painful, tender, fluctuant, and erythematous nodules, frequently surmounted by a pustule and surrounded by a rim of erythematous swelling.  Spontaneous drainage of purulent material may occur.  Fever can occur on occasion.    -Skin abscesses can develop in healthy individuals with no predisposing conditions other than skin or nasal carriage of Staphylococcus aureus.  Individuals in close contact with others who have active infection with skin abscesses are at increased risk which is likely to explain why twin brother has similar episodes.   In addition, any process leading to a breach in the skin barrier can also predispose to the development of a skin abscesses, such as atopic dermatitis.     Labs pending 2-3 days, you will be contacted if positive for any sti and treatment will be sent to the pharmacy, you will have to return to  the clinic if positive for gonorrhea to receive treatment   Please refrain from having sex until labs results, if positive please refrain from having sex until treatment complete and symptoms resolve   If positive for Chlamydia  gonorrhea or trichomoniasis please notify partner or partners so they may tested as well  Moving forward, it is recommended you use some form of protection against the transmission of sti infections  such as condoms or dental dams with each sexual encounter

## 2021-09-14 NOTE — ED Provider Notes (Signed)
[facility]    CSN: [id number] Arrival date & time: 09/14/21  1012      History   Chief Complaint Chief Complaint  Patient presents with   Conjunctivitis   Nasal Congestion    HPI Rayvon Dakin is a 45 y.o. male.   Patient presents with bilateral eye redness and drainage for 7 days. Symptoms started in left eye and initially had swelling which has resolved. Has attempted use of erythromycin ointment which has not helped. Denies blurred vision, floaters, light sensitivity.  Had a cyst that was draining.in right groin, possibly transferred infection. Took doxycyline for the cyst, which has resolved.   Endorses bumps in genital region specifically on the upper pelvis where hair is present. Lesions intermittently drains milky Heli Dino pus. Denies pain, fever, chills, penile discharge, urinary changes. Concerned for STD. No known exposure. 1 partner, no condom use.    Past Medical History:  Diagnosis Date   Anxiety     Patient Active Problem List   Diagnosis Date Noted   Synovitis of hip 06/20/2021    History reviewed. No pertinent surgical history.     Home Medications    Prior to Admission medications   Medication Sig Start Date End Date Taking? Authorizing Provider  baclofen (LIORESAL) 10 MG tablet Take 1 tablet (10 mg total) by mouth 3 (three) times daily. 06/26/21   Myra Rude, MD  HYDROcodone-acetaminophen (NORCO/VICODIN) 5-325 MG tablet Take 1 tablet by mouth every 8 (eight) hours as needed. 06/26/21   Myra Rude, MD  meloxicam (MOBIC) 7.5 MG tablet Take 1 tablet (7.5 mg total) by mouth 2 (two) times daily as needed. 06/26/21   Myra Rude, MD  predniSONE (DELTASONE) 5 MG tablet Take 6 pills for first day, 5 pills second day, 4 pills third day, 3 pills fourth day, 2 pills the fifth day, and 1 pill sixth day. 06/26/21   Myra Rude, MD    Family History Family History  Problem Relation Age of Onset   Heart failure Mother    Diabetes  Mother    Tuberculosis Mother    Dementia Father     Social History Social History   Tobacco Use   Smoking status: Former    Types: Cigarettes    Quit date: 04/03/2014    Years since quitting: 7.4   Smokeless tobacco: Never  Vaping Use   Vaping Use: Never used  Substance Use Topics   Alcohol use: Yes    Comment: occasionally   Drug use: Never     Allergies   Patient has no known allergies.   Review of Systems Review of Systems  Constitutional: Negative.   HENT: Negative.    Eyes:  Positive for discharge and redness. Negative for photophobia, pain, itching and visual disturbance.  Respiratory: Negative.    Cardiovascular: Negative.   Gastrointestinal: Negative.   Genitourinary:  Positive for genital sores. Negative for decreased urine volume, difficulty urinating, dysuria, enuresis, flank pain, frequency, hematuria, penile discharge, penile pain, penile swelling, scrotal swelling, testicular pain and urgency.  Skin: Negative.   Neurological: Negative.     Physical Exam Triage Vital Signs ED Triage Vitals  Enc Vitals Group     BP 09/14/21 1030 (!) 145/97     Pulse Rate 09/14/21 1030 (!) 119     Resp 09/14/21 1030 18     Temp 09/14/21 1030 98.7 F (37.1 C)     Temp Source 09/14/21 1030 Oral     SpO2 09/14/21  1030 98 %     Weight --      Height --      Head Circumference --      Peak Flow --      Pain Score 09/14/21 1028 0     Pain Loc --      Pain Edu? --      Excl. in [place]? --    No data found.  Updated Vital Signs BP (!) 145/97    Pulse (!) 119    Temp 98.7 F (37.1 C) (Oral)    Resp 18    SpO2 98%   Visual Acuity Right Eye Distance:   Left Eye Distance:   Bilateral Distance:    Right Eye Near:   Left Eye Near:    Bilateral Near:     Physical Exam Constitutional:      Appearance: Normal appearance. He is normal weight.  HENT:     Head: Normocephalic.  Eyes:     Extraocular Movements: Extraocular movements intact.     Comments: Bilateral  erythema of the conjunctivae, no swelling, drainage noted. Vision intact, extraocular movement intact   Pulmonary:     Effort: Pulmonary effort is normal.  Genitourinary:      Comments: 3-4 ingrown hair present, non tender, non drainage, site of abscess has healed,  Skin:    General: Skin is warm and dry.  Neurological:     General: No focal deficit present.     Mental Status: He is alert and oriented to person, place, and time. Mental status is at baseline.  Psychiatric:        Mood and Affect: Mood normal.        Behavior: Behavior normal.     UC Treatments / Results  Labs (all labs ordered are listed, but only abnormal results are displayed) Labs Reviewed - No data to display  EKG   Radiology No results found.  Procedures Procedures (including critical care time)  Medications Ordered in UC Medications - No data to display  Initial Impression / Assessment and Plan / UC Course  I have reviewed the triage vital signs and the nursing notes.  Pertinent labs & imaging results that were available during my care of the patient were reviewed by me and considered in my medical decision making (see chart for details).  Bacterial conjunctivitis of both eyes Ingrown hair Routine screening for STI  Discussed with patient low suspicion of cross-contamination from infection of groin cyst, will change medications for treatment of bacterial conjunctivitis, Polytrim prescribed for 7 days use, recommended follow-up with ophthalmology if symptoms continue to persist, advise cool compresses for comfort and oral antihistamines as needed for itching, patient does not wear contacts Genital lesions appear to be ingrown hairs without signs of infection, recommended daily exfoliation with use of textured washcloth and circular motions, will cover for any bacteria,  prescribed Keflex for 5 days, low suspicions of etiology being herpes, discussed with patient STI screening pending, will treat per  protocol, advised abstinence until lab results and/or treatment is complete, recommended condom use during all sexual encounters moving forward Final Clinical Impressions(s) / UC Diagnoses   Final diagnoses:  None   Discharge Instructions   None    ED Prescriptions   None    PDMP not reviewed this encounter.   Hans Eden, NP 09/14/21 [phone]

## 2021-09-14 NOTE — ED Triage Notes (Signed)
Pt is present today with bilateral eye irritation/redness, nasal congestion, and sneezing. Pt states the eye irritation started one week ago and the nasal congestion and sneezing started yesterday

## 2021-09-15 LAB — CYTOLOGY, (ORAL, ANAL, URETHRAL) ANCILLARY ONLY
Chlamydia: NEGATIVE
Comment: NEGATIVE
Comment: NEGATIVE
Comment: NORMAL
Neisseria Gonorrhea: NEGATIVE
Trichomonas: NEGATIVE

## 2022-03-01 ENCOUNTER — Emergency Department (HOSPITAL_BASED_OUTPATIENT_CLINIC_OR_DEPARTMENT_OTHER)
Admission: EM | Admit: 2022-03-01 | Discharge: 2022-03-01 | Disposition: A | Discharge status: Home / Self Care | Payer: Managed Care, Other (non HMO) | Attending: Emergency Medicine | Admitting: Emergency Medicine

## 2022-03-01 ENCOUNTER — Encounter (HOSPITAL_BASED_OUTPATIENT_CLINIC_OR_DEPARTMENT_OTHER): Payer: Self-pay | Admitting: Emergency Medicine

## 2022-03-01 ENCOUNTER — Other Ambulatory Visit: Payer: Self-pay

## 2022-03-01 DIAGNOSIS — R531 Weakness: Secondary | ICD-10-CM | POA: Insufficient documentation

## 2022-03-01 DIAGNOSIS — R5383 Other fatigue: Secondary | ICD-10-CM | POA: Insufficient documentation

## 2022-03-01 DIAGNOSIS — R42 Dizziness and giddiness: Secondary | ICD-10-CM | POA: Insufficient documentation

## 2022-03-01 LAB — CBC
HCT: 43.9 % (ref 39.0–52.0)
Hemoglobin: 14.2 g/dL (ref 13.0–17.0)
MCH: 24.7 pg — ABNORMAL LOW (ref 26.0–34.0)
MCHC: 32.3 g/dL (ref 30.0–36.0)
MCV: 76.5 fL — ABNORMAL LOW (ref 80.0–100.0)
Platelets: 270 10*3/uL (ref 150–400)
RBC: 5.74 MIL/uL (ref 4.22–5.81)
RDW: 18 % — ABNORMAL HIGH (ref 11.5–15.5)
WBC: 7.5 10*3/uL (ref 4.0–10.5)
nRBC: 0 % (ref 0.0–0.2)

## 2022-03-01 LAB — BASIC METABOLIC PANEL
Anion gap: 11 (ref 5–15)
BUN: 16 mg/dL (ref 6–20)
CO2: 23 mmol/L (ref 22–32)
Calcium: 9.7 mg/dL (ref 8.9–10.3)
Chloride: 103 mmol/L (ref 98–111)
Creatinine, Ser: 0.97 mg/dL (ref 0.61–1.24)
GFR, Estimated: >60 mL/min (ref >60)
Glucose, Bld: 104 mg/dL — ABNORMAL HIGH (ref 70–99)
Potassium: 4.3 mmol/L (ref 3.5–5.1)
Sodium: 137 mmol/L (ref 135–145)

## 2022-03-01 MED ORDER — SODIUM CHLORIDE 0.9 % IV BOLUS
1000.0000 mL | Freq: Once | INTRAVENOUS | Status: AC
  Administered 2022-03-01: 1000 mL via INTRAVENOUS

## 2022-03-01 NOTE — ED Triage Notes (Signed)
Pt reports a week of dizziness and left arm tingling/shooting pain.equal grips. Pt also states he feels more tired than usual.

## 2022-03-01 NOTE — ED Provider Notes (Signed)
[facility] Provider Note   CSN: [id number] Arrival date & time: 03/01/22  1113     History  Chief Complaint  Patient presents with   Dizziness    Russell Dawson is a 46 y.o. male.  Patient with no pertinent past medical history presents today with complaints of fatigue and lightheadedness.  He states that same has been ongoing and unchanged for the last several weeks.  He states that he has been more tired than normal and falling asleep faster at night.  Also states that he is felt generally weak and lightheaded as well. Endorses occasional shooting pain in his left arm but none currently. He endorses some mild sinus pressure but denies any headaches. He denies any vision changes, dizziness, room spinning, diplopia, or any focal deficits. He states that he has been working third shift and has some new life stressors related to family issues. Denies any chest pain, shortness of breath, nausea, or vomiting.   The history is provided by the patient. No language interpreter was used.  Dizziness Associated symptoms: weakness   Associated symptoms: no headaches        Home Medications Prior to Admission medications   Medication Sig Start Date End Date Taking? Authorizing Provider  baclofen (LIORESAL) 10 MG tablet Take 1 tablet (10 mg total) by mouth 3 (three) times daily. 06/26/21   Myra RudeSchmitz, Jeremy E, MD  HYDROcodone-acetaminophen (NORCO/VICODIN) 5-325 MG tablet Take 1 tablet by mouth every 8 (eight) hours as needed. 06/26/21   Myra RudeSchmitz, Jeremy E, MD  meloxicam (MOBIC) 7.5 MG tablet Take 1 tablet (7.5 mg total) by mouth 2 (two) times daily as needed. 06/26/21   Myra RudeSchmitz, Jeremy E, MD  predniSONE (DELTASONE) 5 MG tablet Take 6 pills for first day, 5 pills second day, 4 pills third day, 3 pills fourth day, 2 pills the fifth day, and 1 pill sixth day. 06/26/21   Myra RudeSchmitz, Jeremy E, MD  trimethoprim-polymyxin b Joaquim Lai(POLYTRIM) ophthalmic solution Place 1 drop into both eyes  every 4 (four) hours. 09/14/21   Valinda HoarWhite, Adrienne R, NP      Allergies    Patient has no known allergies.    Review of Systems   Review of Systems  Neurological:  Positive for weakness and light-headedness. Negative for dizziness, tremors, seizures, syncope, facial asymmetry, speech difficulty, numbness and headaches.  All other systems reviewed and are negative.   Physical Exam Updated Vital Signs BP 135/87   Pulse 97   Temp 98.2 F (36.8 C)   Resp 18   SpO2 97%  Physical Exam Vitals and nursing note reviewed.  Constitutional:      General: He is not in acute distress.    Appearance: Normal appearance. He is normal weight. He is not ill-appearing, toxic-appearing or diaphoretic.  HENT:     Head: Normocephalic and atraumatic.     Mouth/Throat:     Mouth: Mucous membranes are moist.  Eyes:     Extraocular Movements: Extraocular movements intact.     Conjunctiva/sclera: Conjunctivae normal.     Pupils: Pupils are equal, round, and reactive to light.  Cardiovascular:     Rate and Rhythm: Normal rate and regular rhythm.     Pulses: Normal pulses.     Heart sounds: Normal heart sounds.  Pulmonary:     Effort: Pulmonary effort is normal. No respiratory distress.     Breath sounds: Normal breath sounds.  Abdominal:     General: Abdomen is flat.     Palpations:  Abdomen is soft.  Musculoskeletal:        General: Normal range of motion.     Cervical back: Normal range of motion.  Skin:    General: Skin is warm and dry.  Neurological:     General: No focal deficit present.     Mental Status: He is alert and oriented to person, place, and time.     GCS: GCS eye subscore is 4. GCS verbal subscore is 5. GCS motor subscore is 6.     Sensory: Sensation is intact.     Motor: Motor function is intact.     Coordination: Coordination is intact.     Gait: Gait is intact.     Comments: Alert and oriented to self, place, time and event.    Speech is fluent, clear without dysarthria  or dysphasia.    Strength 5/5 in upper/lower extremities   Sensation intact in upper/lower extremities    CN I not tested  CN II grossly intact visual fields bilaterally. Did not visualize posterior eye.  CN III, IV, VI PERRLA and EOMs intact bilaterally  CN V Intact sensation to sharp and light touch to the face  CN VII facial movements symmetric  CN VIII not tested  CN IX, X no uvula deviation, symmetric rise of soft palate  CN XI 5/5 SCM and trapezius strength bilaterally  CN XII Midline tongue protrusion, symmetric L/R movements   Psychiatric:        Mood and Affect: Mood normal.        Behavior: Behavior normal.     ED Results / Procedures / Treatments   Labs (all labs ordered are listed, but only abnormal results are displayed) Labs Reviewed  CBC - Abnormal; Notable for the following components:      Result Value   MCV 76.5 (*)    MCH 24.7 (*)    RDW 18.0 (*)    All other components within normal limits  BASIC METABOLIC PANEL - Abnormal; Notable for the following components:   Glucose, Bld 104 (*)    All other components within normal limits    EKG EKG Interpretation  Date/Time:  Thursday March 01 2022 11:22:01 EDT Ventricular Rate:  108 PR Interval:  150 QRS Duration: 92 QT Interval:  322 QTC Calculation: 431 R Axis:   5 Text Interpretation: Sinus tachycardia Minimal voltage criteria for LVH, may be normal variant ( R in aVL ) Nonspecific ST and T wave abnormality Abnormal ECG When compared with ECG of 07-Apr-2021 14:21, No significant change was found Confirmed by Marianna Fuss ([id number]) on 03/01/2022 1:14:54 PM  Radiology No results found.  Procedures Procedures    Medications Ordered in ED Medications  sodium chloride 0.9 % bolus 1,000 mL (1,000 mLs Intravenous New Bag/Given 03/01/22 1234)    ED Course/ Medical Decision Making/ A&P                           Medical Decision Making Amount and/or Complexity of Data Reviewed Labs: ordered.   This  patient presents to the ED for concern of lightheadedness and fatigue, this involves an extensive number of treatment options, and is a complaint that carries with it a high risk of complications and morbidity.    Co morbidities that complicate the patient evaluation  None  Lab Tests:  I Ordered, and personally interpreted labs.  The pertinent results include: No leukocytosis or anemia.  No acute laboratory findings  Cardiac Monitoring: / EKG:  The patient was maintained on a cardiac monitor.  I personally viewed and interpreted the cardiac monitored which showed an underlying rhythm of: no STEMI, unchanged from previous   Problem List / ED Course / Critical interventions / Medication management  I ordered medication including fluids for dehydration Reevaluation of the patient after these medicines showed that the patient improved I have reviewed the patients home medicines and have made adjustments as needed  Test / Admission - Considered:  Patient presents today with nonspecific symptoms of lightheadedness and fatigue.  He is afebrile, nontoxic-appearing, and in no acute distress with reassuring vital signs.  He is also alert and oriented and neurologically intact.  Laboratory evaluation reveals no acute findings.  He did state that fluids made him feel somewhat improved.  No further emergent concerns at this time.  No imaging indicated at this time.  Given that the patient works third shift and has had some recent life stressors, suspect this is the etiology of the patient's symptoms.  Will recommend close PCP follow-up for further evaluation and management.  Additionally I have recommended that the patient begin taking a daily multivitamin as vitamin deficiencies could potentially be the explanation for his symptoms.  Patient is understanding and amenable with plan, educated on red flag symptoms of prompt immediate return.  Discharged stable condition.   Final Clinical Impression(s)  / ED Diagnoses Final diagnoses:  Fatigue, unspecified type    Rx / DC Orders ED Discharge Orders     None     An After Visit Summary was printed and given to the patient.     Vear Clock 03/01/22 1506    Milagros Loll, MD 03/02/22 [phone]

## 2022-03-01 NOTE — Discharge Instructions (Signed)
As we discussed, your work-up in the ER today was reassuring for acute abnormality.  I have no further emergent concerns related to your symptoms.  I strongly recommend that you begin taking a daily multivitamin as a vitamin deficiency could be the explanation for your symptoms.  I also strongly recommend that you follow-up with your primary care doctor for continued evaluation and management of your symptoms.  Return if development of any new or worsening symptoms.

## 2022-07-30 IMAGING — CT CT ANGIO CHEST
2 of 7 series · 17 of 46 positions shown · IV contrast (omnipaque)
Comparison: None.

CLINICAL DATA: Concern for pulmonary embolism.

EXAM:
CT ANGIOGRAPHY CHEST WITH CONTRAST
TECHNIQUE: Multidetector CT imaging of the chest was performed using the
standard protocol during bolus administration of intravenous
contrast. Multiplanar CT image reconstructions and MIPs were
obtained to evaluate the vascular anatomy.
CONTRAST:  75mL OMNIPAQUE IOHEXOL 350 MG/ML SOLN

[Series 5: pe axial thins · axial · 0.92mm/px · z∈[+1065,+1319]mm · 14 of 294 slices shown]
[im 20/294  lung]
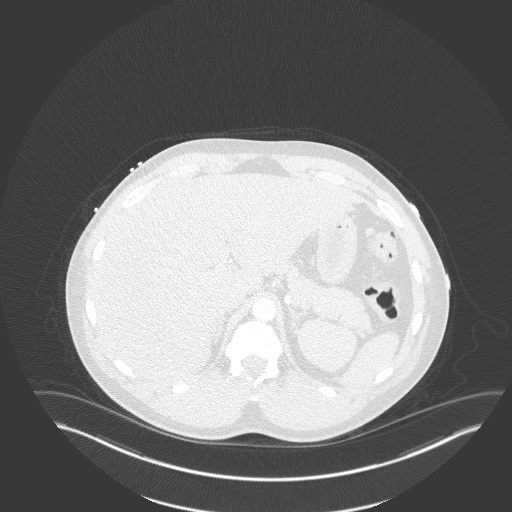
[im 40/294  soft-tissue]
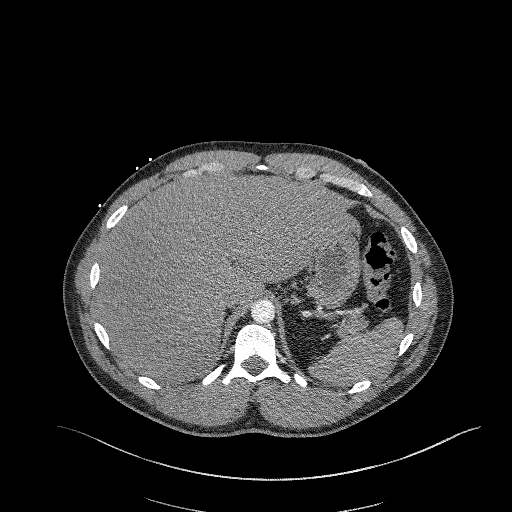
[im 59/294  lung]
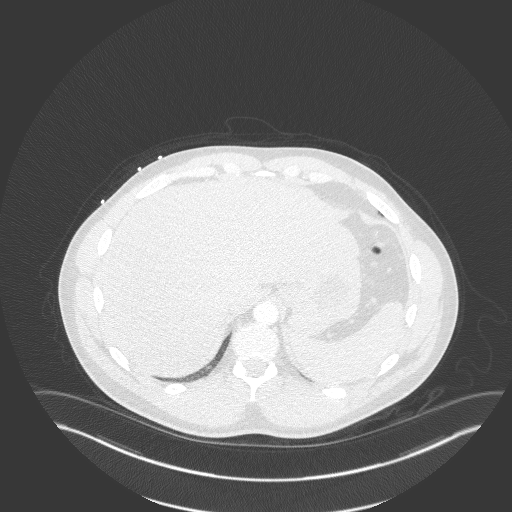
[im 79/294  soft-tissue]
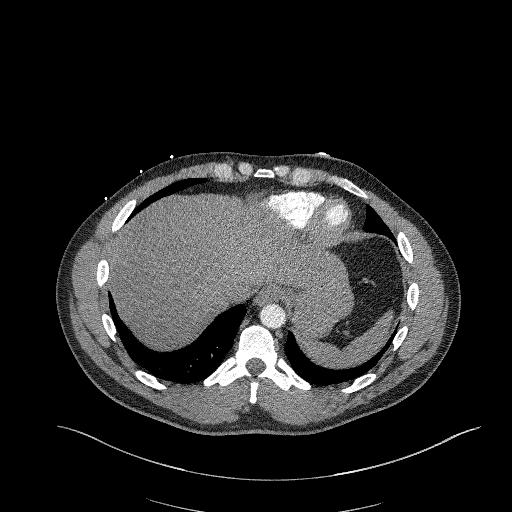
[im 98/294  lung]
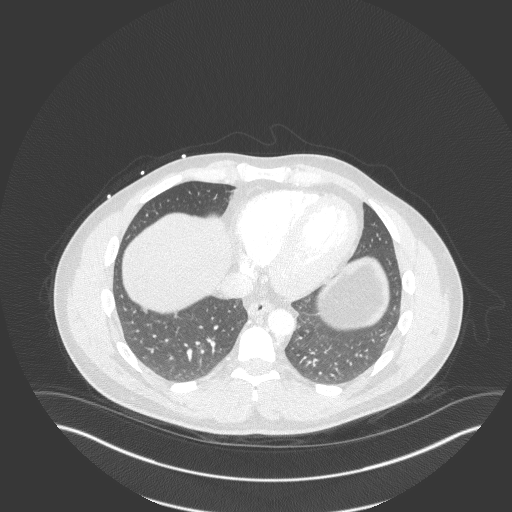
[im 118/294  soft-tissue]
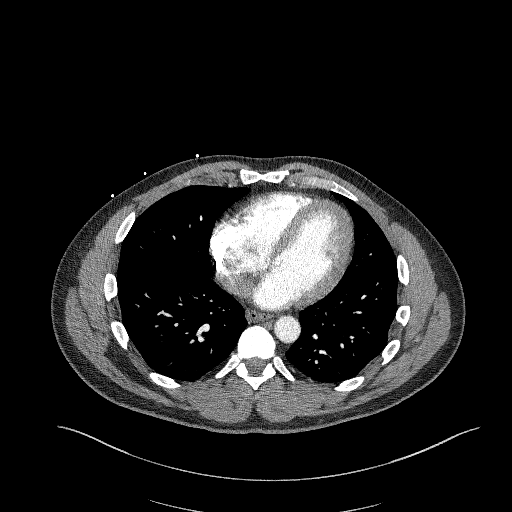
[im 137/294  lung]
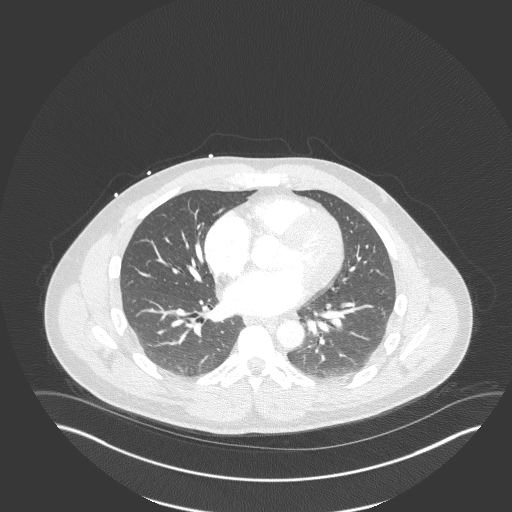
[im 157/294  soft-tissue]
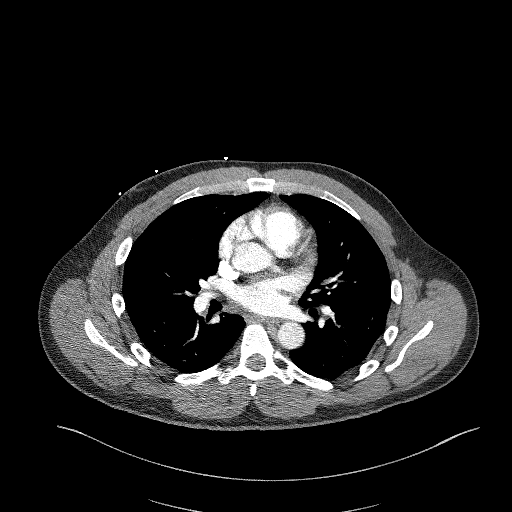
[im 176/294  lung]
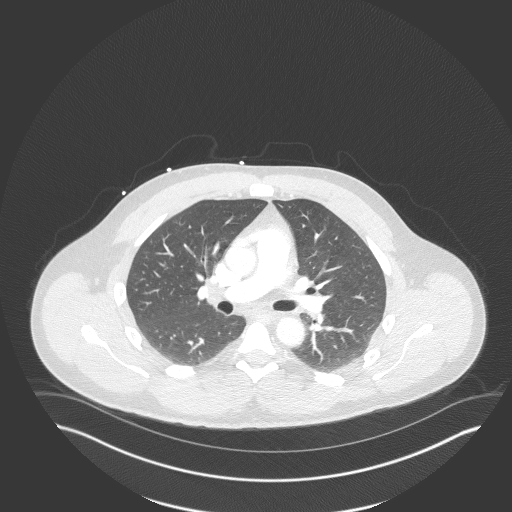
[im 196/294  soft-tissue]
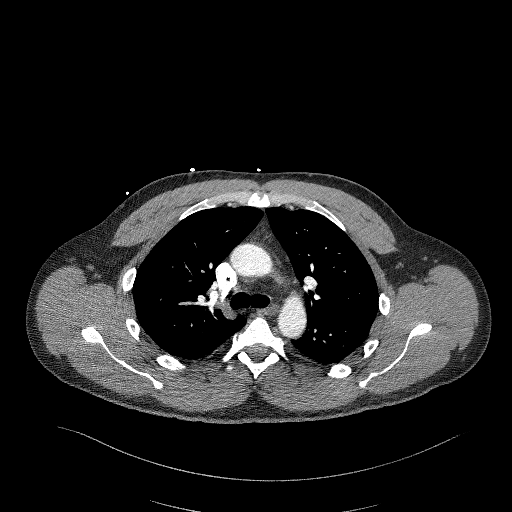
[im 215/294  lung]
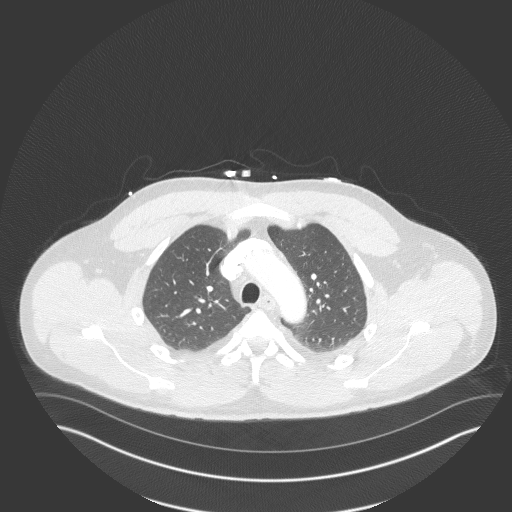
[im 235/294  soft-tissue]
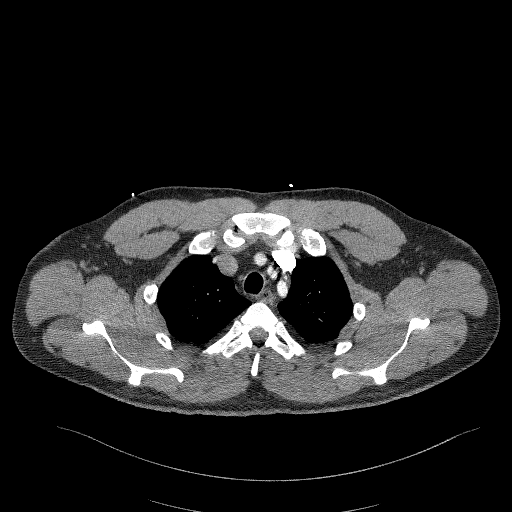
[im 254/294  lung]
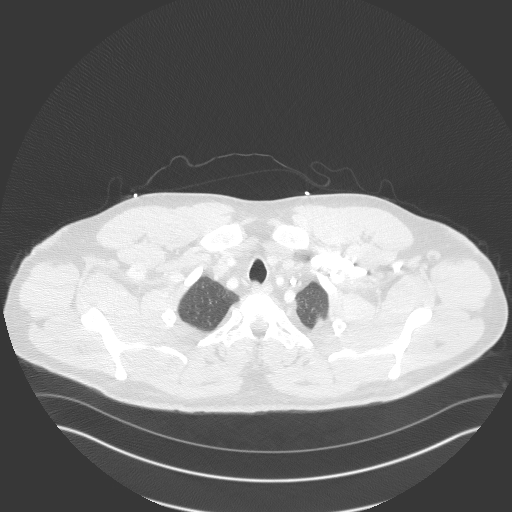
[im 274/294  soft-tissue]
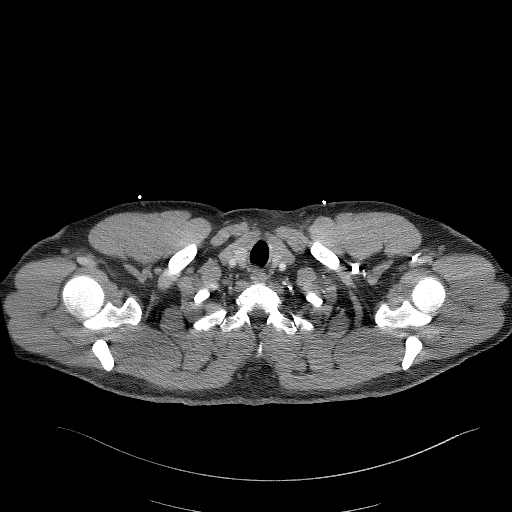

[Series 7: cor soft · coronal · 0.59mm/px · 3 of 127 slices shown]
[im 32/127  soft-tissue]
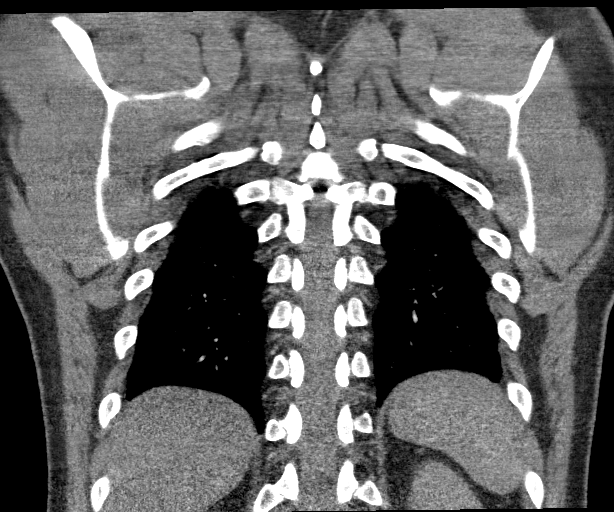
[im 64/127  soft-tissue]
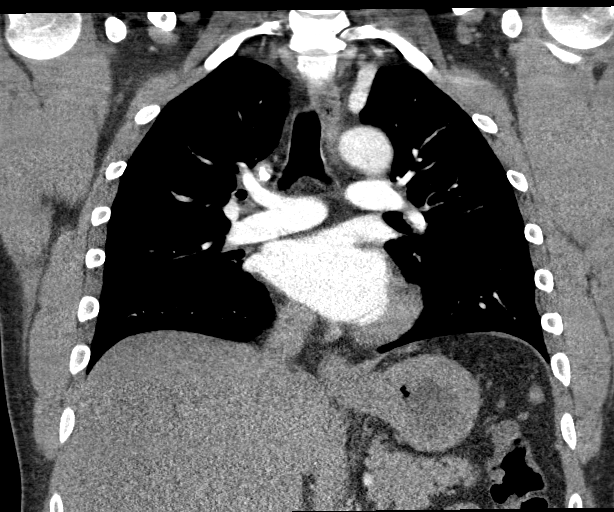
[im 95/127  soft-tissue]
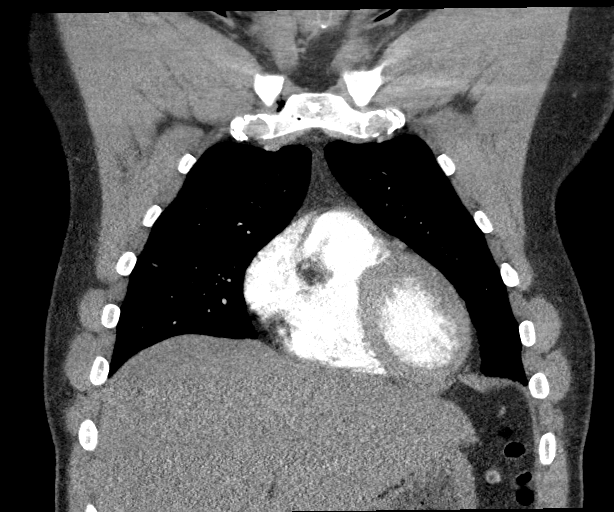

[17 of 46 positions shown; findings below may reference images not displayed]

FINDINGS: Cardiovascular: No filling defects within the pulmonary arteries to
suggest acute pulmonary embolism.

Mediastinum/Nodes: No axillary or supraclavicular adenopathy. No
mediastinal or hilar adenopathy. No pericardial fluid. Esophagus
normal.

Lungs/Pleura: No pulmonary infarction. No pneumonia. No pleural
fluid or pneumothorax.

Upper Abdomen: Limited view of the liver, kidneys, pancreas are
unremarkable. Normal adrenal glands.

Musculoskeletal: No aggressive osseous lesion

Review of the MIP images confirms the above findings.

## 2022-07-30 IMAGING — DX DG CHEST 2V
2 series · 2 of 2 positions shown · non-contrast
Comparison: 01/28/2020

CLINICAL DATA: Chest pain and short of breath

EXAM:
CHEST - 2 VIEW

[chest pa]
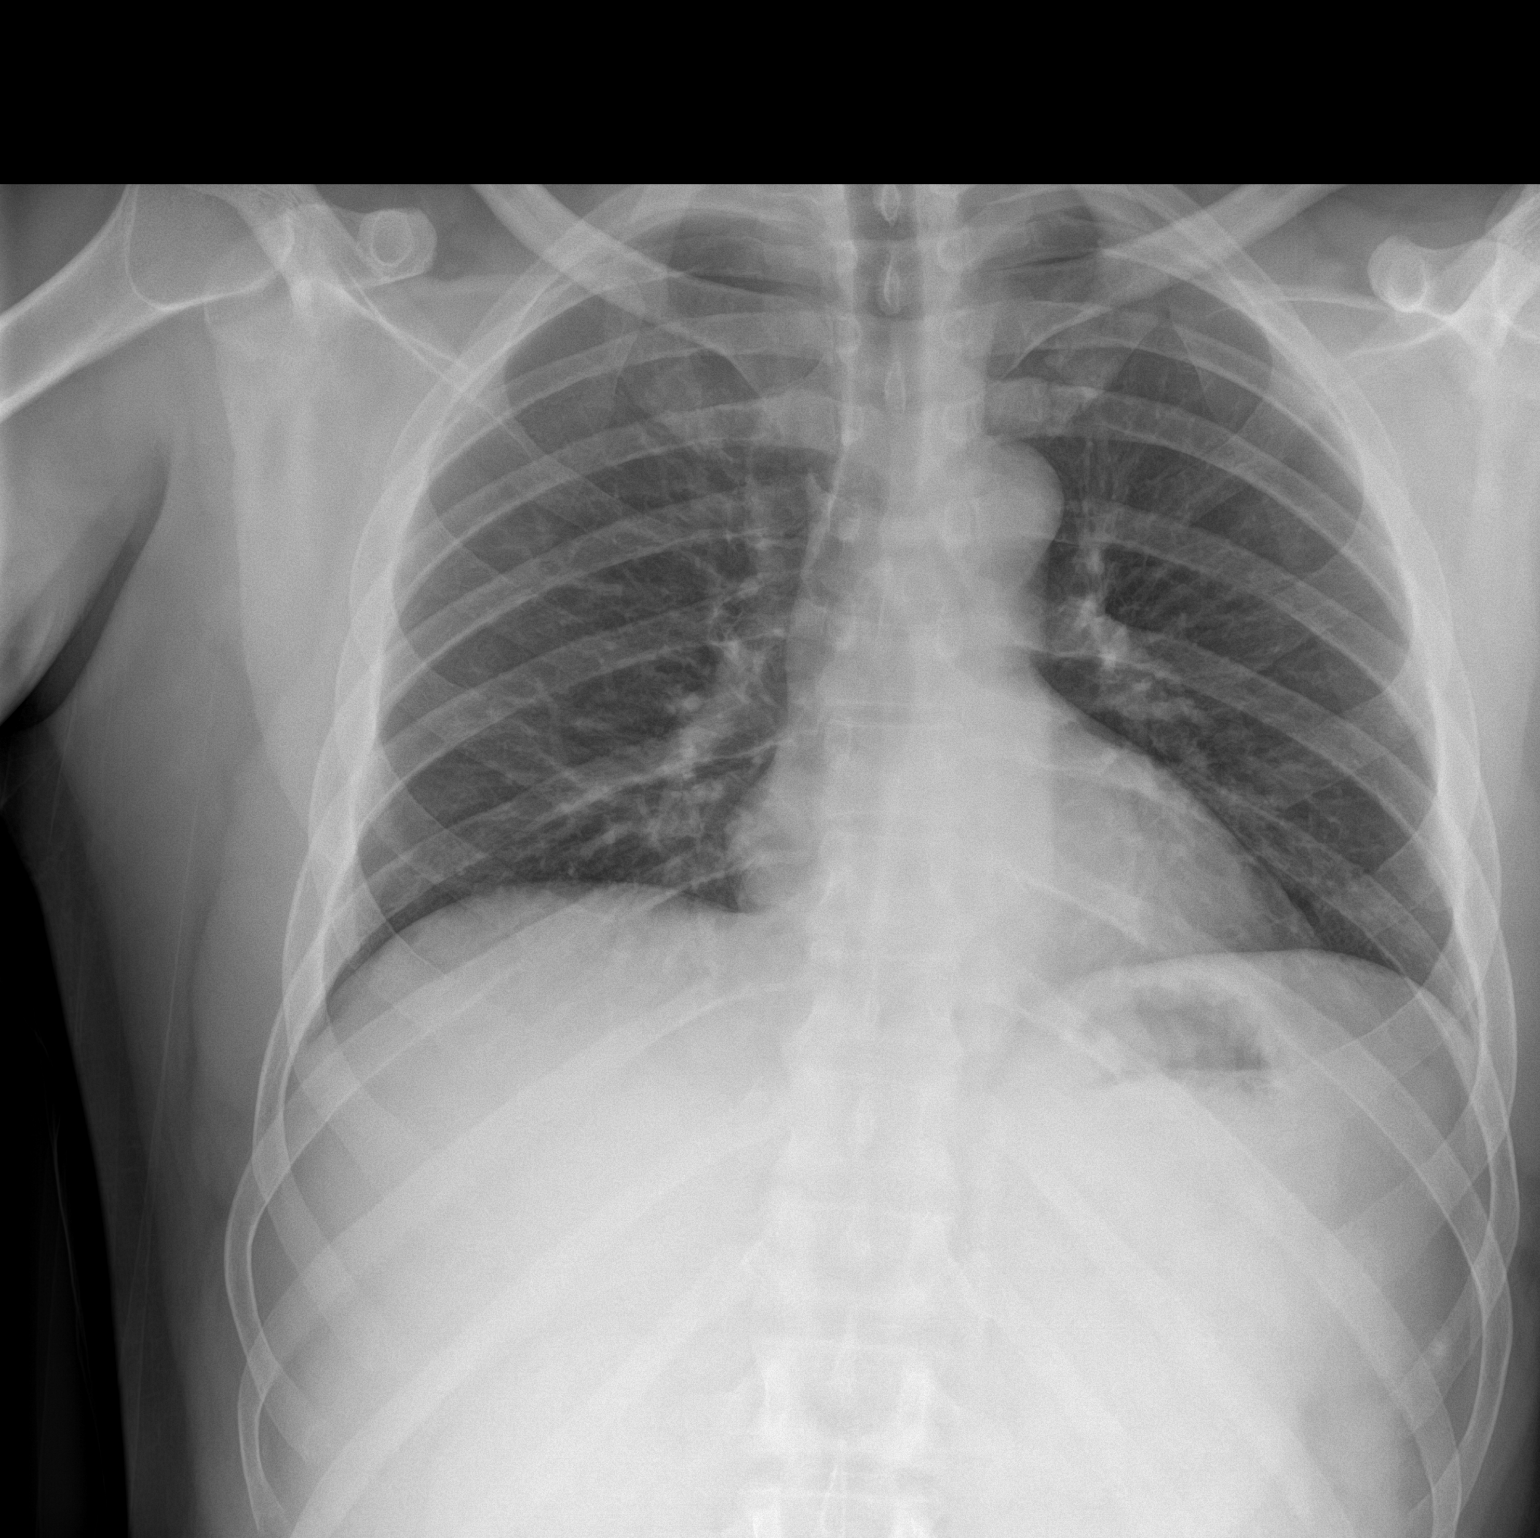

[chest lat]
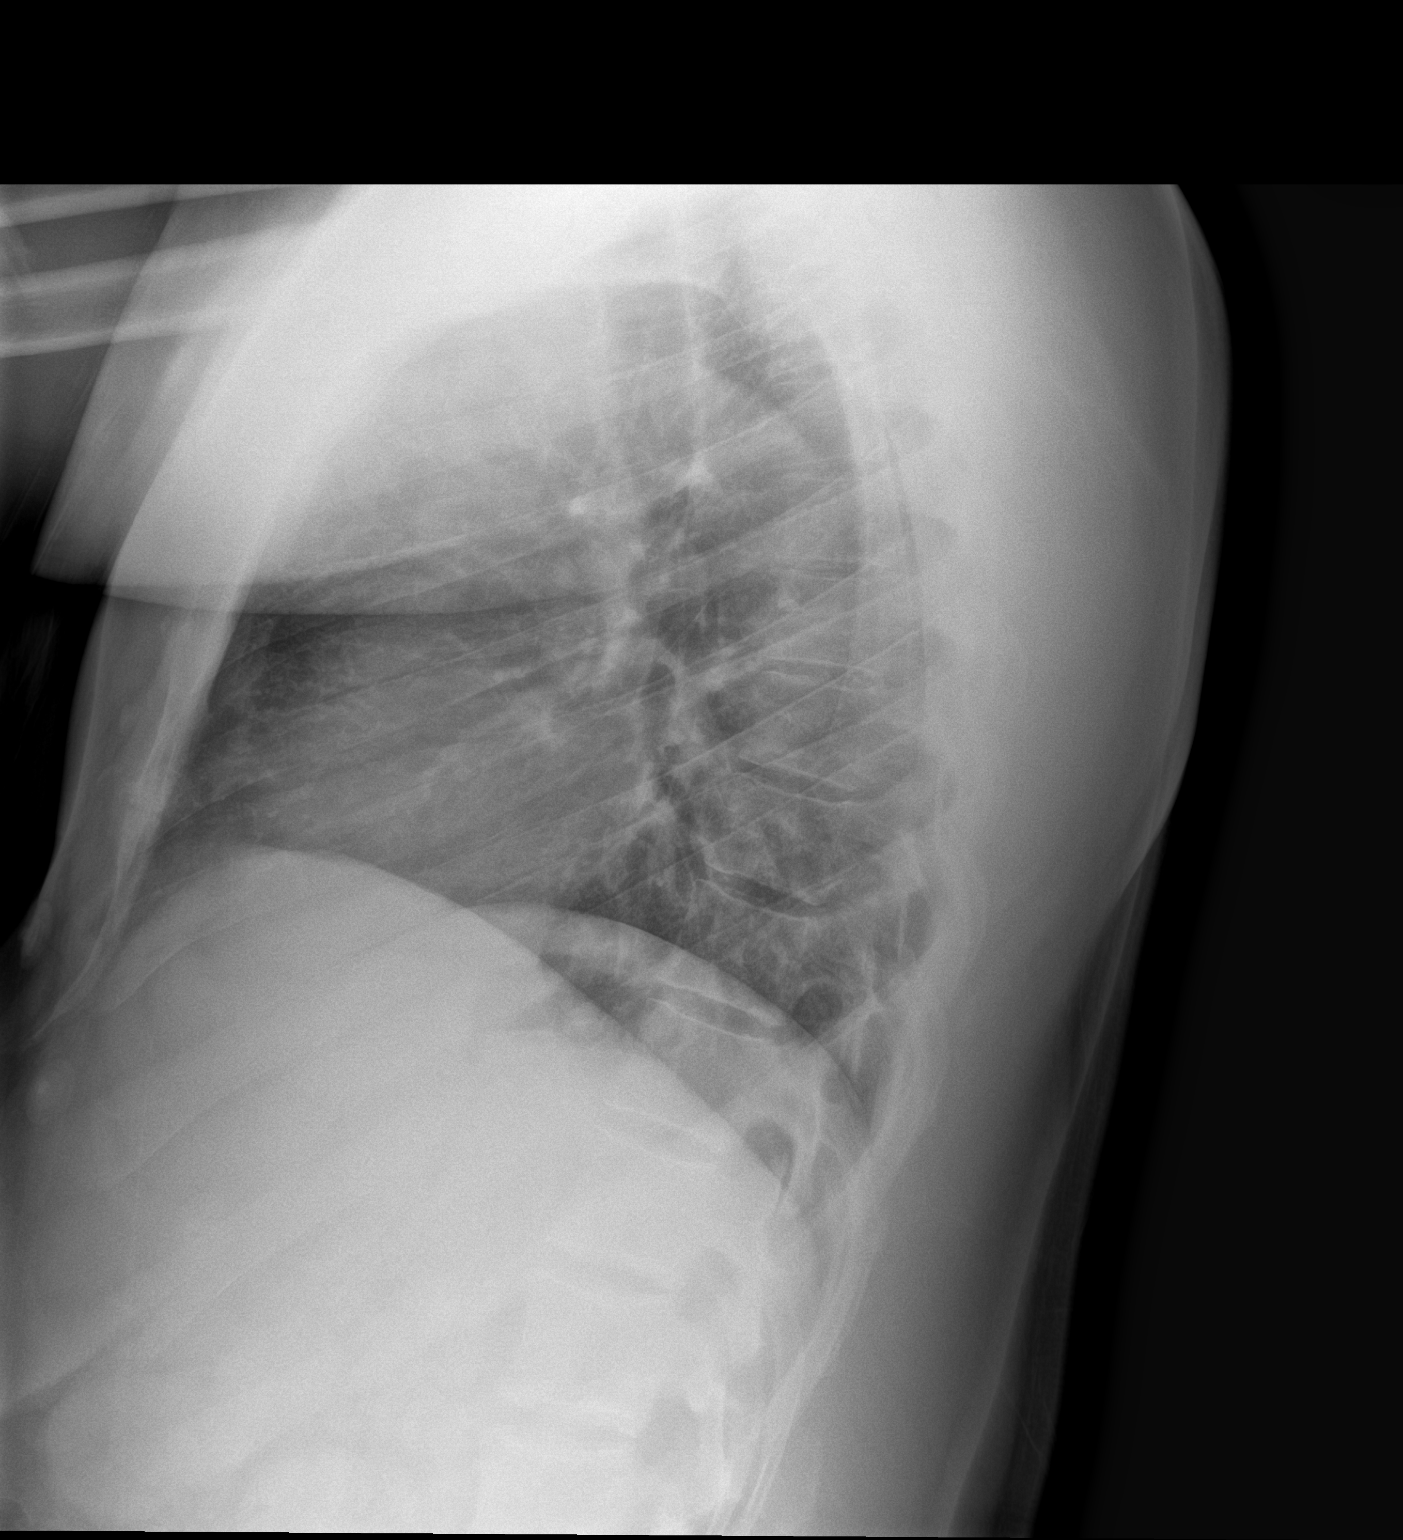

[2 of 2 positions shown; findings below may reference images not displayed]

FINDINGS: The heart size and mediastinal contours are within normal limits.
Both lungs are clear. The visualized skeletal structures are
unremarkable.
IMPRESSION: No active cardiopulmonary disease.

## 2022-10-03 ENCOUNTER — Encounter: Payer: Self-pay | Admitting: Internal Medicine

## 2022-10-03 ENCOUNTER — Ambulatory Visit (INDEPENDENT_AMBULATORY_CARE_PROVIDER_SITE_OTHER): Payer: Managed Care, Other (non HMO) | Admitting: Internal Medicine

## 2022-10-03 VITALS — BP 129/68 | HR 81 | Temp 98.1°F | Ht 71.0 in | Wt 232.0 lb

## 2022-10-03 DIAGNOSIS — E782 Mixed hyperlipidemia: Secondary | ICD-10-CM

## 2022-10-03 DIAGNOSIS — E669 Obesity, unspecified: Secondary | ICD-10-CM

## 2022-10-03 DIAGNOSIS — H66003 Acute suppurative otitis media without spontaneous rupture of ear drum, bilateral: Secondary | ICD-10-CM

## 2022-10-03 DIAGNOSIS — H6993 Unspecified Eustachian tube disorder, bilateral: Secondary | ICD-10-CM

## 2022-10-03 DIAGNOSIS — Z87891 Personal history of nicotine dependence: Secondary | ICD-10-CM

## 2022-10-03 DIAGNOSIS — E038 Other specified hypothyroidism: Secondary | ICD-10-CM | POA: Diagnosis not present

## 2022-10-03 DIAGNOSIS — Z5989 Other problems related to housing and economic circumstances: Secondary | ICD-10-CM

## 2022-10-03 DIAGNOSIS — E785 Hyperlipidemia, unspecified: Secondary | ICD-10-CM | POA: Insufficient documentation

## 2022-10-03 DIAGNOSIS — R718 Other abnormality of red blood cells: Secondary | ICD-10-CM

## 2022-10-03 DIAGNOSIS — G4730 Sleep apnea, unspecified: Secondary | ICD-10-CM

## 2022-10-03 DIAGNOSIS — D563 Thalassemia minor: Secondary | ICD-10-CM | POA: Insufficient documentation

## 2022-10-03 DIAGNOSIS — Z5971 Insufficient health insurance coverage: Secondary | ICD-10-CM | POA: Insufficient documentation

## 2022-10-03 HISTORY — DX: Mixed hyperlipidemia: E78.2

## 2022-10-03 HISTORY — DX: Insufficient health insurance coverage: Z59.71

## 2022-10-03 MED ORDER — METFORMIN HCL 500 MG PO TABS: 500.0000 mg | ORAL_TABLET | Freq: Two times a day (BID) | ORAL | 3 refills | Status: DC

## 2022-10-03 MED ORDER — AMOXICILLIN-POT CLAVULANATE 875-125 MG PO TABS: 1.0000 | ORAL_TABLET | Freq: Two times a day (BID) | ORAL | 0 refills | Status: DC

## 2022-10-03 MED ORDER — FLUTICASONE PROPIONATE 50 MCG/ACT NA SUSP: 2.0000 | Freq: Every day | NASAL | 11 refills | Status: DC

## 2022-10-03 MED ORDER — SIMPLY SALINE 0.9 % NA AERS: 2.0000 | INHALATION_SPRAY | Freq: Four times a day (QID) | NASAL | 2 refills | Status: DC | PRN

## 2022-10-03 MED ORDER — PSEUDOEPHEDRINE HCL ER 120 MG PO TB12: 120.0000 mg | ORAL_TABLET | Freq: Two times a day (BID) | ORAL | 1 refills | Status: DC

## 2022-10-03 NOTE — Assessment & Plan Note (Signed)
>>  ASSESSMENT AND PLAN FOR RBC MICROCYTOSIS WRITTEN ON 10/03/2022  5:38 PM BY Robbye Dede G, MD  Will just check iron... sickle cell and thalassemia tests more expensive.

## 2022-10-03 NOTE — Assessment & Plan Note (Signed)
He request repeat after discussing potential cost, will check t4 also this time.

## 2022-10-03 NOTE — Patient Instructions (Addendum)
This shows  how the eustachian canal works to drain the inner ear and how it is connected to the nasopharynx.  Nasal sinus rinses with saline nasal mist sprays can rinse all of the pollen and allergens and other irritants and pathogens out of his sinuses and nasopharynx, reducing the plugging/swelling around the eustachian tube.  The sinus rinse clears away the mucus and allows nasal steroid sprays to help reach the eustachian tube and reduce swellling to open it up.    Basic Sinus Care: Mist each nostril nightly with sterile saline nasal mist, then spray each nostril immediately after with fluticasone nasal spray (Flonase) or other steroid or antihistamine nasal pray Next, if symptoms persist, add a daily allergy pill.  Benadryl is the strongest but will make you very drowsy so only take when you can sleep after.  Ear Pressure Relief Maneuvers: Step 1 If the congestion is mild, you can often use simple maneuvers to quickly alter the pressure in your middle ear, such as: Swallowing Yawning Chewing gum Sucking on hard candy Similar methods can be used on children. If traveling with an infant or toddler, try giving them a bottle, pacifier, or something to drink or suck on.  Warm compress: Applying a warm, moist cloth to the back of your ear can help reduce swelling and help drain congested passages. In some cases, these interventions will cause the ears will pop without trying. If they don't, give it 20 minutes and see if swallowing, yawning, chewing gum, or sucking on hard candy helps.  Step 2 If these methods alone don't help, you can try other interventions like:  Decongestants: OTC drugs like Afrin (oxymetazoline) or Sudafed (pseudoephedrine) work by reducing the swelling of blood vessels in the nasal passages and Eustachian tubes.  Never use these medications for more than a few days at a time, especially afrin is dependency-forming  If this isn't working or you need more than a few days  of afrin or sudafed... you should make an appointment.   Step 3 (just for mod severe ear pressure and pain) If these interventions don't help, there are three other advanced strategies you can try called the Valsalva maneuver, the Toynbee maneuver, and the Frenzel maneuver.  Advanced Strategy 1:  The Valsalva maneuver Inhale. Pinch your nose shut with your fingers. Keeping your lips tightly shut, blow out forcefully as if you are blowing up a balloon. To increase the pressure, try bearing down as if having a bowel movement.  Advanced Strategy 2:  The Toynbee maneuver The Toynbee maneuver may also be safer than the Valsalva maneuver if you've had a previous eardrum injury. The Valsalva method exerts much more pressure on the eardrum and can possibly cause a rupture if you blow too forcefully.  Keep your mouth tightly shut. Pinch your nose shut with your fingers. Swallow hard.  Advanced Strategy 3: The Frenzel maneuver Pinch your nose shut with your fingers Close your mouth and place the tip of your tongue behind your upper front teeth. Push the back of your tongue to the roof of your mouth as if making a hard "G" or "K" sound. The back of your tongue will touch the roof. While doing this, close your vocal folds at the back of your throat and lift your larynx (voice box) up to push the air out of your mouth and into your nose.  ------------------------------------------------------------------------ If all this fails despite extensive efforts then you need to go to an ear nose and throat for   surgical correction - but this should not be tried until everything else fails.      Please try these tips to maintain a healthy lifestyle:  Avoid processed foods like bologna, salami, spam, candy bars  Try to eat non-starchy and fiber-rich vegetables, 30%-plus lean proteins, and only healthy fats (nuts, extra virgin olive oil, fatty fish) or lean meats.  Try to completely eliminate sugar  containing beverages. This includes juice, non diet soda, and sweet tea.   Drink at least 1 glass of water with each meal and aim for at least 8 glasses per day  Eat a diet rich and fruits and vegetables and healthy fats (plant and fish fats) to reduce risk of heart attack and stroke.  In particular, avocado, extra virgin olive oil, nuts, and fatty fish are known to be great for your blood vessels.  Exercise at least 150 minutes every week.  Try to do all 3 types of exercise: 1)  Stretching (yoga-type), 2) cardio (220-44 y.o.) = 176  is max heart rate) and 3) resistance training exercises for muscle building.  Try to avoid exercise with a lot of impact (running on concrete is hard on joints) and extreme exercise (marathons) which have been shown to do more harm than good.   Keep challenging yourself to make positive and sustainable lifestyle changes. Love and be good to your future self!  You can't hate yourself into positive changes.  If you know you have an unhealthy coping strategy (e.g. substance or food misuse) or if depression/anxiety is holding you back, please let me know;  I am trained in addiction treatment and passionate about helping people to make positive life changes!  Get good sleep (8 hours on a consistent schedule.  Sleep is important for mood management, weight management, and cognitive performance. Snoring and sleep apnea are connected to alzheimer's dementia, depression, obesity, and irritability  For your mental status:  practice gratitude exercises daily, focus on self-growth and creative interests, and use mindfulness to stay out of negative emotional states.

## 2022-10-03 NOTE — Assessment & Plan Note (Signed)
Will just check iron... sickle cell and thalassemia tests more expensive.

## 2022-10-03 NOTE — Assessment & Plan Note (Signed)
I explained I do not think it is worth it to do the lab work if he has to pay full cost but it is up to him

## 2022-10-03 NOTE — Assessment & Plan Note (Addendum)
GLP-1 agonist unaffordable with high deductible savings account Even wellbutrin 150$/month(s)  Will try metformin End weight loss Will reassess thyroid labs.

## 2022-10-03 NOTE — Assessment & Plan Note (Signed)
Recommmend get sleep study, or possibly just buy CPAP oop due to high deductible.

## 2022-10-03 NOTE — Progress Notes (Signed)
[facility]  Phone: [phone]  New patient visit  Visit Date: 10/03/2022 Patient: Russell Dawson   DOB: [birth year]   47 y.o. Male  MRN: [id number]  Today's healthcare provider: Lula Olszewski, MD  Assessment and Plan:   Aveion was seen today for new patient (initial visit), abnormal lab and sinus problem.  Eustachian tube dysfunction, bilateral -     Simply Saline; Place 2 sprays into the nose 4 (four) times daily as needed.  Dispense: 100 mL; Refill: 2 -     Fluticasone Propionate; Place 2 sprays into both nostrils daily.  Dispense: 15.8 mL; Refill: 11 -     Pseudoephedrine HCl ER; Take 1 tablet (120 mg total) by mouth 2 (two) times daily.  Dispense: 20 tablet; Refill: 1  Acute suppurative otitis media of both ears without spontaneous rupture of tympanic membranes, recurrence not specified -     Amoxicillin-Pot Clavulanate; Take 1 tablet by mouth 2 (two) times daily.  Dispense: 20 tablet; Refill: 0  Subclinical hypothyroidism Assessment & Plan: He request repeat after discussing potential cost, will check t4 also this time.  Orders: -     TSH -     T4, free  Mixed hyperlipidemia Overview: Tg over 500   Assessment & Plan: Recommmend fish oil 4 g daily Gave cholesterol lowering diet   History of smoking  Obesity (BMI 30-39.9) Assessment & Plan: GLP-1 agonist unaffordable with high deductible savings account Even wellbutrin 150$/month(s)  Will try metformin End weight loss Will reassess thyroid labs.  Orders: -     metFORMIN HCl; Take 1 tablet (500 mg total) by mouth 2 (two) times daily with a meal.  Dispense: 180 tablet; Refill: 3  RBC microcytosis Overview: In 56s on phone labs shown   Assessment & Plan: Will just check iron... sickle cell and thalassemia tests more expensive.  Orders: -     Iron, TIBC and Ferritin Panel  Has health insurance with inadequate coverage of health expenses Overview: He is on a high deductible health  savings account so that he would have to pay the entire cost of lab work to do additional labs for his problem of elevated thyroid microcytosis  Assessment & Plan: I explained I do not think it is worth it to do the lab work if he has to pay full cost but it is up to him    Sleep apnea, unspecified type Overview: Thick neck, obese by BMI, has been told he stops breathing and snores  Assessment & Plan: Recommmend get sleep study, or possibly just buy CPAP oop due to high deductible.  Orders: -     Ambulatory referral to Sleep Studies    Today's key discussion points and After Visit Summary (AVS) reminders. Problem-associated medical records were reviewed with him during the appointment He was encouraged to contact our office by phone or message via MyChart if he has any questions or concerns regarding our treatment plan (see AVS). He was given an opportunity to ask questions/clarifications about any aspect of the diagnosis and treatment plan at today's visit.  Common side effects, risks, benefits, and alternatives for medications and treatment plan prescribed today were discussed Medication list was reconciled and patient instructions and summary information was documented and made available for him to review in the AVS (see AVS). We discussed red flag symptoms and signs in detail and when to call the office or go to ER if his condition worsens (see AVS). He expressed understanding and AVS  is used to reinforce.  This entire medical encounter document is also available on MyChart for him to review for accuracy and understanding.  Review of this document is encouraged in the AVS. Follow up recommended:  Return in about 1 year (around 10/04/2023) for annual exam (100% covered).   Medical decision making [Medical Problems Considered] 2 or more stable chronic illnesses Prescription drug management  Diagnosis or treatment significantly limited by social determinants of health high deductible  savings account related lab costs.     Subjective:  Patient presents today to establish care.  Chief Complaint  Patient presents with   New Patient (Initial Visit)   Abnormal Lab   Sinus Problem  He recently had labwork done a few days ago showing abnormal labs with TSH around 6 and tg over 500. He is concerned about these numbers, and also I noted MCV was in the 70s Problem-oriented charting was used to develop and update his medical history: Problem  Subclinical Hypothyroidism  Mixed Hyperlipidemia   Tg over 500    History of Smoking  Obesity (Bmi 30-39.9)  Rbc Microcytosis   In 70s on phone labs shown    Eustachian Tube Dysfunction, Bilateral  Has Health Insurance With Inadequate Coverage of Health Expenses   He is on a high deductible health savings account so that he would have to pay the entire cost of lab work to do additional labs for his problem of elevated thyroid microcytosis   Sleep Apnea   Thick neck, obese by BMI, has been told he stops breathing and snores     Depression Screen    10/03/2022    1:18 PM  PHQ 2/9 Scores  PHQ - 2 Score 0   The following were reviewed and entered/updated into his [facility]:  Diagnosis Date   Anxiety    Has health insurance with inadequate coverage of health expenses 10/03/2022   He is on a high deductible health savings account so that he would have to pay the entire cost of lab work to do additional labs for his problem of elevated thyroid microcytosis   Mixed hyperlipidemia 10/03/2022   Tg over 500    History reviewed. No pertinent surgical history. Family History  Problem Relation Age of Onset   Heart failure Mother    Diabetes Mother    Tuberculosis Mother    Dementia Father    Added metformin today, rest he was on at presentation   Current Outpatient Medications (Endocrine & Metabolic):    metFORMIN (GLUCOPHAGE) 500 MG tablet, Take 1 tablet (500 mg total) by mouth 2 (two) times daily  with a meal.   Current Outpatient Medications (Respiratory):    fluticasone (FLONASE) 50 MCG/ACT nasal spray, Place 2 sprays into both nostrils daily.   pseudoephedrine (SUDAFED 12 HOUR) 120 MG 12 hr tablet, Take 1 tablet (120 mg total) by mouth 2 (two) times daily.   Saline (SIMPLY SALINE) 0.9 % AERS, Place 2 sprays into the nose 4 (four) times daily as needed.    Current Outpatient Medications (Other):    amoxicillin-clavulanate (AUGMENTIN) 875-125 MG tablet, Take 1 tablet by mouth 2 (two) times daily.    No Known Allergies Social History   Tobacco Use   Smoking status: Former    Types: Cigarettes    Quit date: 04/03/2014    Years since quitting: 8.5   Smokeless tobacco: Never  Vaping Use   Vaping Use: Never used  Substance Use Topics   Alcohol use:  Not Currently    Comment: daily , 5 beers a day.   Drug use: Never     There is no immunization history on file for this patient.    Objective:  BP 129/68 (BP Location: Left Arm, Patient Position: Sitting)   Pulse 81   Temp 98.1 F (36.7 C) (Temporal)   Ht 5\' 11"  (1.803 m)   Wt 232 lb (105.2 kg)   SpO2 99%   BMI 32.36 kg/m  Body mass index is 32.36 kg/m. indicates this is an Obese male , but waist circumference is a better indicator of healthy body composition. He is very muscular so he is not as overweight as BMI suggests. Physical Exam  Vital signs reviewed.  Nursing notes reviewed. General Appearance/Constitutional:  polite male in no acute distress Musculoskeletal: All extremities are intact.  Neurological:  Awake, alert,  No obvious focal neurological deficits or cognitive impairments Psychiatric:  Appropriate mood, pleasant demeanor    Results Reviewed: Results for orders placed or performed during the hospital encounter of 03/01/22  CBC  Result Value Ref Range   WBC 7.5 4.0 - 10.5 K/uL   RBC 5.74 4.22 - 5.81 MIL/uL   Hemoglobin 14.2 13.0 - 17.0 g/dL   HCT 43.9 39.0 - 52.0 %   MCV 76.5 (L) 80.0 - 100.0  fL   MCH 24.7 (L) 26.0 - 34.0 pg   MCHC 32.3 30.0 - 36.0 g/dL   RDW 18.0 (H) 11.5 - 15.5 %   Platelets 270 150 - 400 K/uL   nRBC 0.0 0.0 - 0.2 %  Basic metabolic panel  Result Value Ref Range   Sodium 137 135 - 145 mmol/L   Potassium 4.3 3.5 - 5.1 mmol/L   Chloride 103 98 - 111 mmol/L   CO2 23 22 - 32 mmol/L   Glucose, Bld 104 (H) 70 - 99 mg/dL   BUN 16 6 - 20 mg/dL   Creatinine, Ser 0.97 0.61 - 1.24 mg/dL   Calcium 9.7 8.9 - 10.3 mg/dL   GFR, Estimated >60 >60 mL/min   Anion gap 11 5 - 15

## 2022-10-03 NOTE — Assessment & Plan Note (Signed)
Recommmend fish oil 4 g daily Gave cholesterol lowering diet

## 2022-10-04 LAB — T4, FREE: Free T4: 0.61 ng/dL (ref 0.60–1.60)

## 2022-10-04 LAB — IRON,TIBC AND FERRITIN PANEL
%SAT: 31 % (calc) (ref 20–48)
Ferritin: 182 ng/mL (ref 38–380)
Iron: 133 ug/dL (ref 50–180)
TIBC: 435 mcg/dL (calc) — ABNORMAL HIGH (ref 250–425)

## 2022-10-04 LAB — TSH: TSH: 4.71 u[IU]/mL (ref 0.35–5.50)

## 2022-10-07 NOTE — Progress Notes (Signed)
Iron levels too high to explain the tiny red blood cells- that means this is likely due to some genetic variation like thalassemia or sickle cell trait.  We can do the additional labwork if desired but its elective.

## 2022-11-08 ENCOUNTER — Institutional Professional Consult (permissible substitution): Payer: Managed Care, Other (non HMO) | Admitting: Neurology

## 2022-11-08 ENCOUNTER — Encounter: Payer: Self-pay | Admitting: Neurology

## 2023-01-07 ENCOUNTER — Encounter: Payer: Self-pay | Admitting: *Deleted

## 2023-01-21 ENCOUNTER — Encounter: Payer: Self-pay | Admitting: Internal Medicine

## 2023-01-21 ENCOUNTER — Ambulatory Visit (INDEPENDENT_AMBULATORY_CARE_PROVIDER_SITE_OTHER): Payer: Managed Care, Other (non HMO) | Admitting: Internal Medicine

## 2023-01-21 VITALS — BP 144/90 | HR 96 | Temp 98.6°F | Ht 71.0 in | Wt 240.6 lb

## 2023-01-21 DIAGNOSIS — R065 Mouth breathing: Secondary | ICD-10-CM

## 2023-01-21 DIAGNOSIS — R682 Dry mouth, unspecified: Secondary | ICD-10-CM | POA: Diagnosis not present

## 2023-01-21 DIAGNOSIS — R6882 Decreased libido: Secondary | ICD-10-CM

## 2023-01-21 DIAGNOSIS — R7989 Other specified abnormal findings of blood chemistry: Secondary | ICD-10-CM | POA: Insufficient documentation

## 2023-01-21 DIAGNOSIS — R718 Other abnormality of red blood cells: Secondary | ICD-10-CM

## 2023-01-21 DIAGNOSIS — E669 Obesity, unspecified: Secondary | ICD-10-CM

## 2023-01-21 DIAGNOSIS — E038 Other specified hypothyroidism: Secondary | ICD-10-CM

## 2023-01-21 DIAGNOSIS — N529 Male erectile dysfunction, unspecified: Secondary | ICD-10-CM

## 2023-01-21 DIAGNOSIS — G4730 Sleep apnea, unspecified: Secondary | ICD-10-CM

## 2023-01-21 HISTORY — DX: Male erectile dysfunction, unspecified: N52.9

## 2023-01-21 HISTORY — DX: Dry mouth, unspecified: R68.2

## 2023-01-21 LAB — TESTOSTERONE: Testosterone: 195.68 ng/dL — ABNORMAL LOW (ref 300.00–890.00)

## 2023-01-21 MED ORDER — OASIS MOISTURIZING MOUTHWASH MT LIQD: 1.0000 | Freq: Four times a day (QID) | OROMUCOSAL | 11 refills | Status: DC

## 2023-01-21 MED ORDER — SILDENAFIL CITRATE 100 MG PO TABS: 50.0000 mg | ORAL_TABLET | Freq: Every day | ORAL | 11 refills | Status: DC | PRN

## 2023-01-21 MED ORDER — BUPROPION HCL ER (SR) 100 MG PO TB12: 100.0000 mg | ORAL_TABLET | Freq: Every morning | ORAL | 3 refills | Status: DC

## 2023-01-21 NOTE — Assessment & Plan Note (Addendum)
Struglling to continue with metformin, and he feels its for diabetes that it might cause diabetes to develop. I checked artificial intelligence to assess this possibilitiy which I had not heard of.  To enhance the efficiency and accuracy of your care plan, I've utilized a secure artificial intelligence (AI) tool to analyze your recent exam findings and lab results. This AI tool helps identify trends and personalize treatment pathways based on established medical guidelines. Importantly, no personally identifiable information was shared with the AI.  I have thoroughly reviewed the AI-generated analysis and agree with its recommendations.  This analysis is included in your MyChart app for your reference.  If you have any questions or require further explanation of the analysis or plan, please don't hesitate to schedule an appointment for a more in-depth discussion. ------------------------- ARTIFICIAL INTELLIGENCE GENERATED CARE PLAN FOLLOWS------------------------  The Question The Question: does metformin induce diabetes in persons who dont have it, if they are taking it for weight loss. Design Strategy Our strategy involves leveraging a comprehensive understanding of pharmacology, endocrinology, and the pathophysiology of diabetes mellitus to systematically analyze the effects of metformin on glucose metabolism and insulin sensitivity. We will examine the mechanism of action of metformin, its role in the management of type 2 diabetes, and its effects on individuals without diabetes, particularly when used for weight loss. This approach ensures a thorough evaluation of the question, grounded in evidence-based medicine and clinical guidelines. Execute Strategy Metformin is primarily used in the management of type 2 diabetes mellitus, where it acts to decrease hepatic glucose production, improve insulin sensitivity, and enhance peripheral glucose uptake. Its mechanism of action does not inherently induce  hyperglycemia or diabetes; rather, it is aimed at improving glycemic control. In individuals without diabetes, metformin has been studied for its effects on weight loss and metabolic improvement. The hypothesis to be analyzed is whether metformin's mechanism could paradoxically induce diabetes in a non-diabetic individual using it for weight loss. Systematically Ensure Accuracy & Precision Upon deeper analysis, considering metformin's pharmacodynamics, it becomes evident that metformin's action on reducing hepatic glucose production and improving insulin sensitivity would not induce diabetes in non-diabetic individuals. Instead, these mechanisms may contribute to a reduced risk of progression to diabetes in those with prediabetes or insulin resistance. Furthermore, studies involving non-diabetic individuals have not demonstrated an induction of diabetes with metformin use; rather, they have shown potential benefits in weight management and metabolic health. Final Answer Metformin does not induce diabetes in persons who do not have it, even if they are taking it for weight loss. Its pharmacological action is geared towards improving insulin sensitivity and reducing glucose production, which are beneficial effects that can contribute to metabolic health and may even prevent the onset of diabetes in predisposed individuals.

## 2023-01-21 NOTE — Assessment & Plan Note (Signed)
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

## 2023-01-21 NOTE — Assessment & Plan Note (Addendum)
Encouraged him to take simply saline nightly rinses followed by Flonase to try to clear up his sinuses but it does not sound like it is working get a go ahead and set him up with an ear nose and throat doctor to see if he might need a turbinectomy Encouraged him to keep mouthwash that is hydrating by the bed like refresh mouthwash and to be aggressive with nasal rinsing until he can get in with ear nose and throat

## 2023-01-21 NOTE — Progress Notes (Signed)
[facility]: [phone]   Routine Medical Office Visit  Patient:  Russell Dawson      Age: 47 y.o.       Sex:  male  Date:   01/21/2023 PCP:    Lula Olszewski, MD   Today's Healthcare Provider: Lula Olszewski, MD    Assessment and Plan:   Obesity (BMI 30-39.9) Assessment & Plan: Struglling to continue with metformin, and he feels its for diabetes that it might cause diabetes to develop. I checked artificial intelligence to assess this possibilitiy which I had not heard of.  To enhance the efficiency and accuracy of your care plan, I've utilized a secure artificial intelligence (AI) tool to analyze your recent exam findings and lab results. This AI tool helps identify trends and personalize treatment pathways based on established medical guidelines. Importantly, no personally identifiable information was shared with the AI.  I have thoroughly reviewed the AI-generated analysis and agree with its recommendations.  This analysis is included in your MyChart app for your reference.  If you have any questions or require further explanation of the analysis or plan, please don't hesitate to schedule an appointment for a more in-depth discussion. ------------------------- ARTIFICIAL INTELLIGENCE GENERATED CARE PLAN FOLLOWS------------------------  The Question The Question: does metformin induce diabetes in persons who dont have it, if they are taking it for weight loss. Design Strategy Our strategy involves leveraging a comprehensive understanding of pharmacology, endocrinology, and the pathophysiology of diabetes mellitus to systematically analyze the effects of metformin on glucose metabolism and insulin sensitivity. We will examine the mechanism of action of metformin, its role in the management of type 2 diabetes, and its effects on individuals without diabetes, particularly when used for weight loss. This approach ensures a thorough evaluation of the question,  grounded in evidence-based medicine and clinical guidelines. Execute Strategy Metformin is primarily used in the management of type 2 diabetes mellitus, where it acts to decrease hepatic glucose production, improve insulin sensitivity, and enhance peripheral glucose uptake. Its mechanism of action does not inherently induce hyperglycemia or diabetes; rather, it is aimed at improving glycemic control. In individuals without diabetes, metformin has been studied for its effects on weight loss and metabolic improvement. The hypothesis to be analyzed is whether metformin's mechanism could paradoxically induce diabetes in a non-diabetic individual using it for weight loss. Systematically Ensure Accuracy & Precision Upon deeper analysis, considering metformin's pharmacodynamics, it becomes evident that metformin's action on reducing hepatic glucose production and improving insulin sensitivity would not induce diabetes in non-diabetic individuals. Instead, these mechanisms may contribute to a reduced risk of progression to diabetes in those with prediabetes or insulin resistance. Furthermore, studies involving non-diabetic individuals have not demonstrated an induction of diabetes with metformin use; rather, they have shown potential benefits in weight management and metabolic health. Final Answer Metformin does not induce diabetes in persons who do not have it, even if they are taking it for weight loss. Its pharmacological action is geared towards improving insulin sensitivity and reducing glucose production, which are beneficial effects that can contribute to metabolic health and may even prevent the onset of diabetes in predisposed individuals.  Orders: -     buPROPion HCl ER (SR); Take 1 tablet (100 mg total) by mouth in the morning.  Dispense: 90 tablet; Refill: 3  Dry mouth Assessment & Plan: Encouraged him to take simply saline nightly rinses followed by Flonase to try to clear up his sinuses but it  does not sound like it  is working get a go ahead and set him up with an ear nose and throat doctor to see if he might need a turbinectomy Encouraged him to keep mouthwash that is hydrating by the bed like refresh mouthwash and to be aggressive with nasal rinsing until he can get in with ear nose and throat    Orders: -     Ambulatory referral to ENT -     Oasis Moisturizing Mouthwash; Use as directed 1 Capful in the mouth or throat every 6 (six) hours.  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  Dispense: 90 tablet; Refill: 11  Mouth breathing -     Ambulatory referral to ENT -     Oasis Moisturizing Mouthwash; Use as directed 1 Capful in the mouth or throat every 6 (six) hours.  Dispense: 473 mL; Refill: 11  RBC microcytosis    Updated chart extensively today - Note that although he mentioned flank pain to nurse as a reason for visit - he hardly mentioned this to me.  He was more concerned with addressing the issues above especially libido and dry mouth  Treatment plan discussed and reviewed in detail. Explained medication safety and potential side effects. Agreed on patient returning to office if symptoms worsen, persist, or new symptoms develop. Discussed  precautions in case of needing to visit the Emergency Department. Answered all patient questions and confirmed understanding and comfort with the plan. Encouraged patient to contact our office if they have any questions or concerns.      Clinical Presentation:   47 y.o. male here today for Flank Pain and Discuss medication (Wants to discuss if he still needs to be taken metformin.)  HPI    Updated chart data:  Problem  Dry Mouth   Severe dry throat upon aweakening, reports this very bothersome Seems due to chronic severe nasal obstructions while sleeping    Low Libido   Patient reports that girlfriend asks him to get checked out for this Only interested in 1x monthly   Erectile Dysfunction   Has girlfriend. Needs solution - libido low.   Hyperlipidemia   Tg over 500    Obesity (Bmi 30-39.9)   Increased body mass noted.  Body mass index is 33.56 kg/m.  Well proportioned with no abnormal fat distribution.  Good muscle tone. No results found for: "HGBA1C" Lab Results  Component Value Date   TSH 4.71 10/03/2022   Lab Results  Component Value Date/Time   GLUCOSE 104 (H) 03/01/2022 12:33 PM   GLUCOSE 90 06/26/2021 03:45 PM   GLUCOSE 121 (H) 04/07/2021 02:22 PM   Wt Readings from Last 10 Encounters:  01/21/23 240 lb 9.6 oz (109.1 kg)  10/03/22 232 lb (105.2 kg)  06/26/21 215 lb (97.5 kg)  06/20/21 215 lb (97.5 kg)  06/16/21 215 lb (97.5 kg)  04/07/21 211 lb (95.7 kg)      Rbc Microcytosis   In 70s long term without anemia or iron deficiency anemia . Most likely thalassemia. 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   Subclinical hypothyroidism 10/03/2022   Hyperlipidemia 10/03/2022   History of smoking 10/03/2022   Obesity (BMI 30-39.9) 10/03/2022   RBC microcytosis 10/03/2022   Eustachian tube dysfunction, bilateral 10/03/2022   Has health insurance with inadequate coverage of health expenses 10/03/2022   Sleep apnea 10/03/2022   Dry mouth 01/21/2023   Low libido 01/21/2023   Erectile dysfunction 01/21/2023   Resolved Ambulatory Problems    Diagnosis Date Noted   No Resolved Ambulatory Problems   Past Medical History:  Diagnosis Date   Anxiety    Mixed hyperlipidemia 10/03/2022    Outpatient Medications Prior to Visit  Medication Sig   fluticasone (FLONASE) 50 MCG/ACT nasal spray Place 2 sprays into both nostrils daily.   metFORMIN (GLUCOPHAGE) 500 MG tablet Take 1 tablet (500 mg total) by mouth 2 (two) times daily with a meal.   pseudoephedrine (SUDAFED 12 HOUR) 120 MG 12 hr tablet Take 1 tablet (120 mg total) by mouth 2 (two) times daily.   Saline (SIMPLY SALINE) 0.9 % AERS Place 2 sprays into the nose 4 (four) times daily as needed.   amoxicillin-clavulanate (AUGMENTIN) 875-125 MG tablet Take 1 tablet by mouth 2 (two) times daily. (Patient not taking: Reported on 01/21/2023)   No facility-administered medications prior to visit.             Clinical Data Analysis:   Physical Exam  BP (!) 144/90 (BP Location: Left Arm, Patient Position: Sitting)   Pulse 96   Temp 98.6 F (37 C) (Temporal)   Ht 5\' 11"  (1.803 m)   Wt 240 lb 9.6 oz (109.1 kg)   SpO2 97%   BMI 33.56 kg/m  Wt Readings from Last 10 Encounters:  01/21/23 240 lb 9.6 oz (109.1 kg)  10/03/22 232 lb (105.2 kg)  06/26/21 215 lb (97.5 kg)  06/20/21 215 lb (97.5 kg)  06/16/21 215 lb (97.5 kg)  04/07/21 211 lb (95.7 kg)   Vital signs reviewed.   Nursing notes reviewed. Weight trend reviewed. Abnormalities and Problem-Specific physical exam findings:  truncal adiposity  General Appearance:  No acute distress appreciable.   Well-groomed, healthy-appearing male.  Well proportioned with no abnormal fat distribution.  Good muscle tone. Skin: Clear and well-hydrated. Pulmonary:  Normal work of breathing at rest, no respiratory distress apparent. SpO2: 97 %  Musculoskeletal: All extremities are intact.  Neurological:  Awake, alert, oriented, and engaged.  No obvious focal neurological deficits or cognitive impairments.  Sensorium seems unclouded.   Speech is clear and coherent with logical content. Psychiatric:  Appropriate mood,  pleasant and cooperative demeanor, cheerful and engaged during the exam   Additional Results Reviewed:     Results for orders placed or performed in visit on 01/21/23  Testosterone  Result Value Ref Range   Testosterone 195.68 (L) 300.00 - 890.00 ng/dL    Recent Results (from the past 2160 hour(s))  Testosterone     Status: Abnormal   Collection Time: 01/21/23 12:21 PM  Result Value Ref Range   Testosterone 195.68 (L) 300.00 - 890.00 ng/dL    No image results found.   No results found.   --------------------------------    Signed: Lula Olszewski, MD 01/21/2023 8:40 PM

## 2023-01-21 NOTE — Patient Instructions (Addendum)
It was a pleasure seeing you today!  Your health and satisfaction are my top priorities. If you believe your experience today was worthy of a 5-star rating, I'd be grateful for your feedback! Lula Olszewski, MD   Next Steps: Schedule Follow-Up:  If any of your medical issues become urgent or worsen, please don't hesitate to reach out or seek emergency room care. In the meantime, I strongly encourage scheduling routine follow up appointments prior to leaving or calling [phone] if you don't have one scheduled yet.  We recommend your next follow-up appointment no later than Return in about 2 months (around 03/23/2023) for weight loss med management, chronic disease monitoring and management.  Please return sooner if you are not doing well.  Preventive Care:  Don't forget to schedule your annual preventive care visit!  This important checkup is typically covered by insurance and helps identify potential health issues early.  Typically its 100% insurance covered with no co-pay and helps to get surveillance labwork paid for.  Lab & X-ray Appointments:  Scheduled any incomplete lab tests today or call us to schedule.  XRays can be done without an appointment at [facility] at [facility] ([address], [address]), M-F 8:30am-noon or 1pm-5pm.  Just tell them you're there for X-rays ordered by Dr. Jon Billings.  We'll receive the results and contact you by phone or MyChart to discuss next steps.  Medical Information Release:  If you have any relevant medical information we don't have, please sign a release form so we can obtain it for your records.  Bring to Your Next Appointment: Medications: Please bring all your medication bottles to your next appointment to ensure we have an accurate record of your prescriptions. Health Diaries: If you're monitoring any health conditions at home, keeping a diary of your readings can be very helpful for discussions at your next appointment.  Please Review your early  draft clinical notes below and the final encounter summary tomorrow on MyChart after its been completed.    Obesity (BMI 30-39.9) Assessment & Plan: Struglling to continue with metformin, and he feels its for diabetes that it might cause diabetes to develop. I checked artificial intelligence to assess this possibilitiy which I had not heard of.  To enhance the efficiency and accuracy of your care plan, I've utilized a secure artificial intelligence (AI) tool to analyze your recent exam findings and lab results. This AI tool helps identify trends and personalize treatment pathways based on established medical guidelines. Importantly, no personally identifiable information was shared with the AI.  I have thoroughly reviewed the AI-generated analysis and agree with its recommendations.  This analysis is included in your MyChart app for your reference.  If you have any questions or require further explanation of the analysis or plan, please don't hesitate to schedule an appointment for a more in-depth discussion.  Lula Olszewski, MD  01/21/2023 12:00 PM    ------------------------- ARTIFICIAL INTELLIGENCE GENERATED CARE PLAN FOLLOWS------------------------ 01/21/2023 12:00 PM    ------------------------- ARTIFICIAL INTELLIGENCE GENERATED CARE PLAN FOLLOWS------------------------  The Question The Question: does metformin induce diabetes in persons who dont have it, if they are taking it for weight loss. Design Strategy Our strategy involves leveraging a comprehensive understanding of pharmacology, endocrinology, and the pathophysiology of diabetes mellitus to systematically analyze the effects of metformin on glucose metabolism and insulin sensitivity. We will examine the mechanism of action of metformin, its role in the management of type 2 diabetes, and its effects on individuals without  diabetes, particularly when used for weight loss. This approach ensures a thorough evaluation of the question,  grounded in evidence-based medicine and clinical guidelines. Execute Strategy Metformin is primarily used in the management of type 2 diabetes mellitus, where it acts to decrease hepatic glucose production, improve insulin sensitivity, and enhance peripheral glucose uptake. Its mechanism of action does not inherently induce hyperglycemia or diabetes; rather, it is aimed at improving glycemic control. In individuals without diabetes, metformin has been studied for its effects on weight loss and metabolic improvement. The hypothesis to be analyzed is whether metformin's mechanism could paradoxically induce diabetes in a non-diabetic individual using it for weight loss. Systematically Ensure Accuracy & Precision Upon deeper analysis, considering metformin's pharmacodynamics, it becomes evident that metformin's action on reducing hepatic glucose production and improving insulin sensitivity would not induce diabetes in non-diabetic individuals. Instead, these mechanisms may contribute to a reduced risk of progression to diabetes in those with prediabetes or insulin resistance. Furthermore, studies involving non-diabetic individuals have not demonstrated an induction of diabetes with metformin use; rather, they have shown potential benefits in weight management and metabolic health. Final Answer Metformin does not induce diabetes in persons who do not have it, even if they are taking it for weight loss. Its pharmacological action is geared towards improving insulin sensitivity and reducing glucose production, which are beneficial effects that can contribute to metabolic health and may even prevent the onset of diabetes in predisposed individuals.   Dry mouth Assessment & Plan: Encouraged him to take simply saline nightly rinses followed by Flonase to try to clear up his sinuses but it does not sound like it is working get a go ahead and set him up with an ear nose and throat doctor to see if he might need a  turbinectomy     Sleep apnea, unspecified type Assessment & Plan: He missed appointment for sleep study encouraged him to return   Low libido -     Testosterone; Standing  Erectile dysfunction, unspecified erectile dysfunction type -     Sildenafil Citrate; Take 0.5-1 tablets (50-100 mg total) by mouth daily as needed for erectile dysfunction.  Dispense: 90 tablet; Refill: 11     Getting Answers and Following Up: Simple Questions & Concerns: For quick questions or basic follow-up after your visit, reach [place] at [phone] or MyChart messaging. Complex Concerns: If your concern is more complex, scheduling an appointment might be best. Discuss this with the staff to find the most suitable option. Lab & Imaging Results: We'll contact you directly if results are abnormal or you don't use MyChart. Most normal results will be on MyChart within 2-3 business days, with a review message from Dr. Jon Billings. Haven't heard back in 2 weeks? Need results sooner? Contact us at [phone]. Referrals: Our referral coordinator will manage specialist referrals. The specialist's office should contact you within 2 weeks to schedule an appointment. Call us if you haven't heard from them after 2 weeks.  Staying Connected:  MyChart: Activate your MyChart for the fastest way to access results and message [place]. See the last page of this paperwork for instructions.  Billing: X-ray & Lab Orders: These are billed by separate companies. Contact the invoicing company directly for questions or concerns. Visit Charges: Discuss any billing inquiries with our administrative services team.  Feedback & Satisfaction: Share Your Experience: We strive for your satisfaction! If you have any complaints, please let Dr. Jon Billings know directly or contact our Practice Administrators, Edwena Felty or C[place]aredge  Boutaib, by asking at the front desk.  Scheduling Tips: Shorter Wait Times: 8 am and 1 pm appointments often have the  quickest wait times. Longer Appointments: If you need more time during your visit, talk to the front desk. Due to insurance regulations, multiple back-to-back appointments might be necessary.

## 2023-01-21 NOTE — Assessment & Plan Note (Signed)
Sent Viagra to [facility] to save money on script

## 2023-01-21 NOTE — Assessment & Plan Note (Addendum)
He missed appointment for sleep study encouraged him to return Encouraged to reschedule this I think it will do a lot for the libido and weight loss Reassured patient the CPAP is quite helpful if needed not to worry about his fears of wearing mask

## 2023-01-26 ENCOUNTER — Encounter (HOSPITAL_COMMUNITY): Payer: Self-pay | Admitting: *Deleted

## 2023-01-26 ENCOUNTER — Other Ambulatory Visit: Payer: Self-pay

## 2023-01-26 ENCOUNTER — Observation Stay (HOSPITAL_COMMUNITY): Payer: Managed Care, Other (non HMO)

## 2023-01-26 ENCOUNTER — Emergency Department (HOSPITAL_COMMUNITY): Payer: Managed Care, Other (non HMO)

## 2023-01-26 ENCOUNTER — Inpatient Hospital Stay (HOSPITAL_COMMUNITY)
Admission: EM | Admit: 2023-01-26 | Discharge: 2023-01-29 | DRG: 311 | Disposition: A | Discharge status: Home / Self Care | Payer: Managed Care, Other (non HMO) | Attending: Internal Medicine | Admitting: Internal Medicine

## 2023-01-26 DIAGNOSIS — R7303 Prediabetes: Secondary | ICD-10-CM

## 2023-01-26 DIAGNOSIS — N179 Acute kidney failure, unspecified: Secondary | ICD-10-CM

## 2023-01-26 DIAGNOSIS — F419 Anxiety disorder, unspecified: Secondary | ICD-10-CM | POA: Diagnosis present

## 2023-01-26 DIAGNOSIS — F17210 Nicotine dependence, cigarettes, uncomplicated: Secondary | ICD-10-CM | POA: Diagnosis present

## 2023-01-26 DIAGNOSIS — Z8249 Family history of ischemic heart disease and other diseases of the circulatory system: Secondary | ICD-10-CM

## 2023-01-26 DIAGNOSIS — I2489 Other forms of acute ischemic heart disease: Secondary | ICD-10-CM | POA: Diagnosis not present

## 2023-01-26 DIAGNOSIS — Z833 Family history of diabetes mellitus: Secondary | ICD-10-CM

## 2023-01-26 DIAGNOSIS — Z818 Family history of other mental and behavioral disorders: Secondary | ICD-10-CM

## 2023-01-26 DIAGNOSIS — Z8739 Personal history of other diseases of the musculoskeletal system and connective tissue: Secondary | ICD-10-CM

## 2023-01-26 DIAGNOSIS — E860 Dehydration: Secondary | ICD-10-CM

## 2023-01-26 DIAGNOSIS — Z6832 Body mass index (BMI) 32.0-32.9, adult: Secondary | ICD-10-CM

## 2023-01-26 DIAGNOSIS — F1010 Alcohol abuse, uncomplicated: Secondary | ICD-10-CM

## 2023-01-26 DIAGNOSIS — F1410 Cocaine abuse, uncomplicated: Secondary | ICD-10-CM | POA: Diagnosis present

## 2023-01-26 DIAGNOSIS — D72828 Other elevated white blood cell count: Secondary | ICD-10-CM | POA: Diagnosis present

## 2023-01-26 DIAGNOSIS — Z7984 Long term (current) use of oral hypoglycemic drugs: Secondary | ICD-10-CM

## 2023-01-26 DIAGNOSIS — R0789 Other chest pain: Secondary | ICD-10-CM | POA: Diagnosis not present

## 2023-01-26 DIAGNOSIS — E6609 Other obesity due to excess calories: Secondary | ICD-10-CM | POA: Diagnosis present

## 2023-01-26 DIAGNOSIS — R748 Abnormal levels of other serum enzymes: Secondary | ICD-10-CM

## 2023-01-26 DIAGNOSIS — R072 Precordial pain: Secondary | ICD-10-CM

## 2023-01-26 DIAGNOSIS — F17200 Nicotine dependence, unspecified, uncomplicated: Secondary | ICD-10-CM

## 2023-01-26 DIAGNOSIS — R000 Tachycardia, unspecified: Secondary | ICD-10-CM

## 2023-01-26 DIAGNOSIS — R079 Chest pain, unspecified: Secondary | ICD-10-CM | POA: Diagnosis present

## 2023-01-26 DIAGNOSIS — E872 Acidosis, unspecified: Secondary | ICD-10-CM | POA: Diagnosis present

## 2023-01-26 DIAGNOSIS — E669 Obesity, unspecified: Secondary | ICD-10-CM

## 2023-01-26 DIAGNOSIS — M6282 Rhabdomyolysis: Secondary | ICD-10-CM

## 2023-01-26 DIAGNOSIS — R7989 Other specified abnormal findings of blood chemistry: Secondary | ICD-10-CM

## 2023-01-26 DIAGNOSIS — F1090 Alcohol use, unspecified, uncomplicated: Secondary | ICD-10-CM

## 2023-01-26 DIAGNOSIS — E782 Mixed hyperlipidemia: Secondary | ICD-10-CM | POA: Diagnosis present

## 2023-01-26 HISTORY — DX: Acute kidney failure, unspecified: N17.9

## 2023-01-26 HISTORY — DX: Precordial pain: R07.2

## 2023-01-26 HISTORY — DX: Other specified abnormal findings of blood chemistry: R79.89

## 2023-01-26 HISTORY — DX: Personal history of other diseases of the musculoskeletal system and connective tissue: Z87.39

## 2023-01-26 HISTORY — DX: Tachycardia, unspecified: R00.0

## 2023-01-26 HISTORY — DX: Nicotine dependence, cigarettes, uncomplicated: F17.210

## 2023-01-26 LAB — CBC
HCT: 40.1 % (ref 39.0–52.0)
Hemoglobin: 13.8 g/dL (ref 13.0–17.0)
MCH: 25.7 pg — ABNORMAL LOW (ref 26.0–34.0)
MCHC: 34.4 g/dL (ref 30.0–36.0)
MCV: 74.7 fL — ABNORMAL LOW (ref 80.0–100.0)
Platelets: 282 10*3/uL (ref 150–400)
RBC: 5.37 MIL/uL (ref 4.22–5.81)
RDW: 17.7 % — ABNORMAL HIGH (ref 11.5–15.5)
WBC: 14.9 10*3/uL — ABNORMAL HIGH (ref 4.0–10.5)
nRBC: 0.3 % — ABNORMAL HIGH (ref 0.0–0.2)

## 2023-01-26 LAB — ECHOCARDIOGRAM COMPLETE
Area-P 1/2: 4.86 cm2
Height: 71 in
S' Lateral: 3 cm
Weight: 3679.04 oz

## 2023-01-26 LAB — BASIC METABOLIC PANEL
Anion gap: 19 — ABNORMAL HIGH (ref 5–15)
Anion gap: 14 (ref 5–15)
BUN: 12 mg/dL (ref 6–20)
BUN: 9 mg/dL (ref 6–20)
CO2: 16 mmol/L — ABNORMAL LOW (ref 22–32)
CO2: 20 mmol/L — ABNORMAL LOW (ref 22–32)
Calcium: 10.3 mg/dL (ref 8.9–10.3)
Calcium: 9.2 mg/dL (ref 8.9–10.3)
Chloride: 104 mmol/L (ref 98–111)
Chloride: 102 mmol/L (ref 98–111)
Creatinine, Ser: 1.57 mg/dL — ABNORMAL HIGH (ref 0.61–1.24)
Creatinine, Ser: 1.08 mg/dL (ref 0.61–1.24)
GFR, Estimated: 55 mL/min — ABNORMAL LOW (ref >60)
GFR, Estimated: >60 mL/min (ref >60)
Glucose, Bld: 161 mg/dL — ABNORMAL HIGH (ref 70–99)
Glucose, Bld: 107 mg/dL — ABNORMAL HIGH (ref 70–99)
Potassium: 4.1 mmol/L (ref 3.5–5.1)
Potassium: 4.1 mmol/L (ref 3.5–5.1)
Sodium: 139 mmol/L (ref 135–145)
Sodium: 136 mmol/L (ref 135–145)

## 2023-01-26 LAB — RAPID URINE DRUG SCREEN, HOSP PERFORMED
Amphetamines: NOT DETECTED
Barbiturates: NOT DETECTED
Benzodiazepines: NOT DETECTED
Cocaine: POSITIVE — AB
Opiates: NOT DETECTED
Tetrahydrocannabinol: NOT DETECTED

## 2023-01-26 LAB — CBC WITH DIFFERENTIAL/PLATELET
Abs Immature Granulocytes: 0.22 10*3/uL — ABNORMAL HIGH (ref 0.00–0.07)
Basophils Absolute: 0.1 10*3/uL (ref 0.0–0.1)
Basophils Relative: 0 %
Eosinophils Absolute: 0 10*3/uL (ref 0.0–0.5)
Eosinophils Relative: 0 %
HCT: 45.6 % (ref 39.0–52.0)
Hemoglobin: 14.9 g/dL (ref 13.0–17.0)
Immature Granulocytes: 1 %
Lymphocytes Relative: 10 %
Lymphs Abs: 1.7 10*3/uL (ref 0.7–4.0)
MCH: 24.7 pg — ABNORMAL LOW (ref 26.0–34.0)
MCHC: 32.7 g/dL (ref 30.0–36.0)
MCV: 75.5 fL — ABNORMAL LOW (ref 80.0–100.0)
Monocytes Absolute: 1.3 10*3/uL — ABNORMAL HIGH (ref 0.1–1.0)
Monocytes Relative: 8 %
Neutro Abs: 14.2 10*3/uL — ABNORMAL HIGH (ref 1.7–7.7)
Neutrophils Relative %: 81 %
Platelets: 331 10*3/uL (ref 150–400)
RBC: 6.04 MIL/uL — ABNORMAL HIGH (ref 4.22–5.81)
RDW: 18.2 % — ABNORMAL HIGH (ref 11.5–15.5)
WBC: 17.5 10*3/uL — ABNORMAL HIGH (ref 4.0–10.5)
nRBC: 0.4 % — ABNORMAL HIGH (ref 0.0–0.2)

## 2023-01-26 LAB — TROPONIN I (HIGH SENSITIVITY)
Troponin I (High Sensitivity): 34 ng/L — ABNORMAL HIGH (ref <18)
Troponin I (High Sensitivity): 42 ng/L — ABNORMAL HIGH (ref <18)
Troponin I (High Sensitivity): 54 ng/L — ABNORMAL HIGH (ref <18)
Troponin I (High Sensitivity): 49 ng/L — ABNORMAL HIGH (ref <18)

## 2023-01-26 LAB — CK: Total CK: 3031 U/L — ABNORMAL HIGH (ref 49–397)

## 2023-01-26 LAB — ETHANOL: Alcohol, Ethyl (B): <10 mg/dL (ref <10)

## 2023-01-26 LAB — D-DIMER, QUANTITATIVE: D-Dimer, Quant: 0.38 ug/mL-FEU (ref 0.00–0.50)

## 2023-01-26 LAB — TSH: TSH: 1.855 u[IU]/mL (ref 0.350–4.500)

## 2023-01-26 LAB — HIV ANTIBODY (ROUTINE TESTING W REFLEX): HIV Screen 4th Generation wRfx: NONREACTIVE

## 2023-01-26 LAB — MAGNESIUM: Magnesium: 3.5 mg/dL — ABNORMAL HIGH (ref 1.7–2.4)

## 2023-01-26 MED ORDER — ACETAMINOPHEN 325 MG PO TABS: 650.0000 mg | ORAL_TABLET | ORAL | Status: DC | PRN

## 2023-01-26 MED ORDER — LACTATED RINGERS IV SOLN: INTRAVENOUS | Status: DC

## 2023-01-26 MED ORDER — ALPRAZOLAM 0.25 MG PO TABS
0.2500 mg | ORAL_TABLET | Freq: Two times a day (BID) | ORAL | Status: DC | PRN
  Administered 2023-01-26: 0.25 mg via ORAL
  Filled 2023-01-26: qty 1

## 2023-01-26 MED ORDER — ONDANSETRON HCL 4 MG/2ML IJ SOLN: 4.0000 mg | Freq: Four times a day (QID) | INTRAMUSCULAR | Status: DC | PRN

## 2023-01-26 MED ORDER — MUPIROCIN CALCIUM 2 % EX CREA
TOPICAL_CREAM | Freq: Two times a day (BID) | CUTANEOUS | Status: DC
  Filled 2023-01-26: qty 15

## 2023-01-26 MED ORDER — FENTANYL CITRATE PF 50 MCG/ML IJ SOSY
50.0000 ug | PREFILLED_SYRINGE | Freq: Once | INTRAMUSCULAR | Status: AC
  Administered 2023-01-26: 50 ug via INTRAVENOUS
  Filled 2023-01-26: qty 1

## 2023-01-26 MED ORDER — LORAZEPAM 2 MG/ML IJ SOLN
0.5000 mg | Freq: Once | INTRAMUSCULAR | Status: AC
Start: 2023-01-26 — End: 2023-01-26
  Administered 2023-01-26: 0.5 mg via INTRAVENOUS
  Filled 2023-01-26: qty 1

## 2023-01-26 MED ORDER — OXYCODONE HCL 5 MG PO TABS
5.0000 mg | ORAL_TABLET | Freq: Three times a day (TID) | ORAL | Status: DC | PRN
  Administered 2023-01-27 – 2023-01-29 (×2): 5 mg via ORAL
  Filled 2023-01-26 (×2): qty 1

## 2023-01-26 MED ORDER — TRAMADOL HCL 50 MG PO TABS
50.0000 mg | ORAL_TABLET | Freq: Four times a day (QID) | ORAL | Status: DC | PRN
  Administered 2023-01-26 – 2023-01-28 (×4): 50 mg via ORAL
  Filled 2023-01-26 (×4): qty 1

## 2023-01-26 MED ORDER — LACTATED RINGERS IV BOLUS
2000.0000 mL | Freq: Once | INTRAVENOUS | Status: AC
  Administered 2023-01-26: 2000 mL via INTRAVENOUS

## 2023-01-26 MED ORDER — LACTATED RINGERS IV BOLUS
1000.0000 mL | Freq: Once | INTRAVENOUS | Status: AC
  Administered 2023-01-26: 1000 mL via INTRAVENOUS

## 2023-01-26 MED ORDER — ALUM & MAG HYDROXIDE-SIMETH 200-200-20 MG/5ML PO SUSP: 30.0000 mL | Freq: Four times a day (QID) | ORAL | Status: DC | PRN
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
## 2023-01-26 NOTE — Progress Notes (Signed)
  Echocardiogram 2D Echocardiogram has been performed.  Delcie Roch 01/26/2023, 6:16 PM

## 2023-01-27 DIAGNOSIS — M6282 Rhabdomyolysis: Secondary | ICD-10-CM

## 2023-01-27 DIAGNOSIS — R079 Chest pain, unspecified: Secondary | ICD-10-CM

## 2023-01-27 DIAGNOSIS — E860 Dehydration: Secondary | ICD-10-CM | POA: Diagnosis present

## 2023-01-27 DIAGNOSIS — E6609 Other obesity due to excess calories: Secondary | ICD-10-CM | POA: Diagnosis present

## 2023-01-27 DIAGNOSIS — R7303 Prediabetes: Secondary | ICD-10-CM | POA: Diagnosis present

## 2023-01-27 DIAGNOSIS — I2489 Other forms of acute ischemic heart disease: Secondary | ICD-10-CM | POA: Diagnosis present

## 2023-01-27 DIAGNOSIS — Z833 Family history of diabetes mellitus: Secondary | ICD-10-CM | POA: Diagnosis not present

## 2023-01-27 DIAGNOSIS — F17210 Nicotine dependence, cigarettes, uncomplicated: Secondary | ICD-10-CM | POA: Diagnosis present

## 2023-01-27 DIAGNOSIS — Z818 Family history of other mental and behavioral disorders: Secondary | ICD-10-CM | POA: Diagnosis not present

## 2023-01-27 DIAGNOSIS — F419 Anxiety disorder, unspecified: Secondary | ICD-10-CM | POA: Diagnosis present

## 2023-01-27 DIAGNOSIS — F1010 Alcohol abuse, uncomplicated: Secondary | ICD-10-CM | POA: Diagnosis present

## 2023-01-27 DIAGNOSIS — R0789 Other chest pain: Secondary | ICD-10-CM | POA: Diagnosis present

## 2023-01-27 DIAGNOSIS — R072 Precordial pain: Secondary | ICD-10-CM | POA: Diagnosis not present

## 2023-01-27 DIAGNOSIS — Z6832 Body mass index (BMI) 32.0-32.9, adult: Secondary | ICD-10-CM | POA: Diagnosis not present

## 2023-01-27 DIAGNOSIS — F1410 Cocaine abuse, uncomplicated: Secondary | ICD-10-CM | POA: Diagnosis present

## 2023-01-27 DIAGNOSIS — E872 Acidosis, unspecified: Secondary | ICD-10-CM | POA: Diagnosis present

## 2023-01-27 DIAGNOSIS — D72828 Other elevated white blood cell count: Secondary | ICD-10-CM | POA: Diagnosis present

## 2023-01-27 DIAGNOSIS — Z7984 Long term (current) use of oral hypoglycemic drugs: Secondary | ICD-10-CM | POA: Diagnosis not present

## 2023-01-27 DIAGNOSIS — E782 Mixed hyperlipidemia: Secondary | ICD-10-CM | POA: Diagnosis present

## 2023-01-27 DIAGNOSIS — N179 Acute kidney failure, unspecified: Secondary | ICD-10-CM | POA: Diagnosis present

## 2023-01-27 DIAGNOSIS — Z8249 Family history of ischemic heart disease and other diseases of the circulatory system: Secondary | ICD-10-CM | POA: Diagnosis not present

## 2023-01-27 DIAGNOSIS — R000 Tachycardia, unspecified: Secondary | ICD-10-CM | POA: Diagnosis present

## 2023-01-27 HISTORY — DX: Rhabdomyolysis: M62.82

## 2023-01-27 HISTORY — DX: Chest pain, unspecified: R07.9

## 2023-01-27 LAB — COMPREHENSIVE METABOLIC PANEL
ALT: 30 U/L (ref 0–44)
AST: 64 U/L — ABNORMAL HIGH (ref 15–41)
Albumin: 3.5 g/dL (ref 3.5–5.0)
Alkaline Phosphatase: 52 U/L (ref 38–126)
Anion gap: 9 (ref 5–15)
BUN: 5 mg/dL — ABNORMAL LOW (ref 6–20)
CO2: 26 mmol/L (ref 22–32)
Calcium: 8.8 mg/dL — ABNORMAL LOW (ref 8.9–10.3)
Chloride: 104 mmol/L (ref 98–111)
Creatinine, Ser: 1.05 mg/dL (ref 0.61–1.24)
GFR, Estimated: >60 mL/min (ref >60)
Glucose, Bld: 132 mg/dL — ABNORMAL HIGH (ref 70–99)
Potassium: 3.9 mmol/L (ref 3.5–5.1)
Sodium: 139 mmol/L (ref 135–145)
Total Bilirubin: 0.8 mg/dL (ref 0.3–1.2)
Total Protein: 6.3 g/dL — ABNORMAL LOW (ref 6.5–8.1)

## 2023-01-27 LAB — CBC
HCT: 36.6 % — ABNORMAL LOW (ref 39.0–52.0)
Hemoglobin: 12.1 g/dL — ABNORMAL LOW (ref 13.0–17.0)
MCH: 24.9 pg — ABNORMAL LOW (ref 26.0–34.0)
MCHC: 33.1 g/dL (ref 30.0–36.0)
MCV: 75.5 fL — ABNORMAL LOW (ref 80.0–100.0)
Platelets: 217 10*3/uL (ref 150–400)
RBC: 4.85 MIL/uL (ref 4.22–5.81)
RDW: 17.2 % — ABNORMAL HIGH (ref 11.5–15.5)
WBC: 6.9 10*3/uL (ref 4.0–10.5)
nRBC: 0 % (ref 0.0–0.2)

## 2023-01-27 LAB — CK
Total CK: 5504 U/L — ABNORMAL HIGH (ref 49–397)
Total CK: 4314 U/L — ABNORMAL HIGH (ref 49–397)

## 2023-01-27 LAB — LIPID PANEL
Cholesterol: 149 mg/dL (ref 0–200)
HDL: 23 mg/dL — ABNORMAL LOW (ref >40)
LDL Cholesterol: UNDETERMINED mg/dL (ref 0–99)
Total CHOL/HDL Ratio: 6.5 RATIO
Triglycerides: 568 mg/dL — ABNORMAL HIGH (ref <150)
VLDL: UNDETERMINED mg/dL (ref 0–40)

## 2023-01-27 LAB — LDL CHOLESTEROL, DIRECT: Direct LDL: 53 mg/dL (ref 0–99)

## 2023-01-27 MED ORDER — LORAZEPAM 1 MG PO TABS: 1.0000 mg | ORAL_TABLET | ORAL | Status: DC | PRN

## 2023-01-27 MED ORDER — FOLIC ACID 1 MG PO TABS
1.0000 mg | ORAL_TABLET | Freq: Every day | ORAL | Status: DC
  Administered 2023-01-27 – 2023-01-29 (×3): 1 mg via ORAL
  Filled 2023-01-27 (×3): qty 1

## 2023-01-27 MED ORDER — ADULT MULTIVITAMIN W/MINERALS CH
1.0000 | ORAL_TABLET | Freq: Every day | ORAL | Status: DC
  Administered 2023-01-27 – 2023-01-29 (×3): 1 via ORAL
  Filled 2023-01-27 (×3): qty 1

## 2023-01-27 MED ORDER — THIAMINE HCL 100 MG/ML IJ SOLN: 100.0000 mg | Freq: Every day | INTRAMUSCULAR | Status: DC

## 2023-01-27 MED ORDER — THIAMINE MONONITRATE 100 MG PO TABS
100.0000 mg | ORAL_TABLET | Freq: Every day | ORAL | Status: DC
  Administered 2023-01-27 – 2023-01-29 (×3): 100 mg via ORAL
  Filled 2023-01-27 (×4): qty 1

## 2023-01-27 MED ORDER — LORAZEPAM 2 MG/ML IJ SOLN: 1.0000 mg | INTRAMUSCULAR | Status: DC | PRN

## 2023-01-27 NOTE — Plan of Care (Signed)

## 2023-01-27 NOTE — Progress Notes (Signed)
Progress Note  Patient Name: Russell Dawson MRN: [id number] DOB: 08/18/1976 Date of Encounter: 01/27/2023  Attending physician: Russell Dawson  Subjective: Russell Dawson is a 47 y.o. African-American male who was seen and examined at bedside  Continues to have chest pain-improved. Chest pain located over the precordium. No discomfort with effort related activities. Overall improving Case discussed and reviewed with his nurse.  Objective: Vital Signs in the last 24 hours: Temp:  [98 F (36.7 C)-98.4 F (36.9 C)] 98.2 F (36.8 C) (05/05 1016) Pulse Rate:  [87-112] 93 (05/05 1016) Resp:  [18-20] 20 (05/05 1016) BP: (115-135)/(80-97) 135/84 (05/05 1016) SpO2:  [100 %] 100 % (05/05 0523)  Intake/Output: No intake or output data in the 24 hours ending 01/27/23 1711  Net IO Since Admission: 1,048.43 mL [01/27/23 1711]  Weights:     01/26/2023    2:27 PM 01/26/2023    5:53 AM 01/21/2023   11:17 AM  Last 3 Weights  Weight (lbs) 229 lb 15 oz 235 lb 240 lb 9.6 oz  Weight (kg) 104.3 kg 106.595 kg 109.135 kg      Telemetry:  Overnight telemetry shows sinus rhythm/sinus tachycardia without ectopy, which I personally reviewed.   Physical examination: PHYSICAL EXAM: Temp:  [98 F (36.7 C)-98.4 F (36.9 C)] 98.2 F (36.8 C) (05/05 1016) Pulse Rate:  [87-112] 93 (05/05 1016) Cardiac Rhythm: Normal sinus rhythm (05/05 0740) Resp:  [18-20] 20 (05/05 1016) BP: (115-135)/(80-97) 135/84 (05/05 1016) SpO2:  [100 %] 100 % (05/05 0523)   Vitals:   01/26/23 2033 01/26/23 2356 01/27/23 0523 01/27/23 1016  BP: 135/89 115/80 (!) 135/97 135/84  Pulse: (!) 112 90 87 93  Resp: 18 19 18 20   Temp: 98.2 F (36.8 C) 98 F (36.7 C) 98.4 F (36.9 C) 98.2 F (36.8 C)  TempSrc: Oral Oral Oral Oral  SpO2: 100% 100% 100%   Weight:      Height:        Physical Exam  Constitutional: No distress.  Age appropriate, hemodynamically stable.   Neck: No JVD  present.  Cardiovascular: Normal rate, regular rhythm, S1 normal, S2 normal, intact distal pulses and normal pulses. Exam reveals no gallop, no S3 and no S4.  No murmur heard. Pulmonary/Chest: Effort normal and breath sounds normal. No stridor. He has no wheezes. He has no rales.  Abdominal: Soft. Bowel sounds are normal. He exhibits no distension. There is no abdominal tenderness.  Musculoskeletal:        General: No edema.     Cervical back: Neck supple.  Neurological: He is alert and oriented to person, place, and time. He has intact cranial nerves (2-12).  Skin: Skin is warm and moist.    Lab Results: Chemistry Recent Labs  Lab 01/26/23 0611 01/26/23 0937 01/27/23 0954  NA 139 136 139  K 4.1 4.1 3.9  CL 104 102 104  CO2 16* 20* 26  GLUCOSE 161* 107* 132*  BUN 12 9 5*  CREATININE 1.57* 1.08 1.05  CALCIUM 10.3 9.2 8.8*  PROT  --   --  6.3*  ALBUMIN  --   --  3.5  AST  --   --  64*  ALT  --   --  30  ALKPHOS  --   --  52  BILITOT  --   --  0.8  GFRNONAA 55* >60 >60  ANIONGAP 19* 14 9    Hematology Recent Labs  Lab 01/26/23 [phone]  01/26/23 0937 01/27/23 0954  WBC 17.5* 14.9* 6.9  RBC 6.04* 5.37 4.85  HGB 14.9 13.8 12.1*  HCT 45.6 40.1 36.6*  MCV 75.5* 74.7* 75.5*  MCH 24.7* 25.7* 24.9*  MCHC 32.7 34.4 33.1  RDW 18.2* 17.7* 17.2*  PLT 331 282 217   High Sensitivity Troponin:   Recent Labs  Lab 01/26/23 0611 01/26/23 0741 01/26/23 1202 01/26/23 1439  TROPONINIHS 34* 42* 54* 49*     Cardiac EnzymesNo results for input(s): "TROPONINI" in the last 168 hours. No results for input(s): "TROPIPOC" in the last 168 hours.  BNPNo results for input(s): "BNP", "PROBNP" in the last 168 hours.  DDimer  Recent Labs  Lab 01/26/23 0937  DDIMER 0.38    Hemoglobin A1c: No results found for: "HGBA1C", "MPG" TSH  Recent Labs    10/03/22 1431 01/26/23 0937  TSH 4.71 1.855   Lipid Panel  Lab Results  Component Value Date   CHOL 149 01/27/2023   HDL 23 (L)  01/27/2023   LDLCALC UNABLE TO CALCULATE IF TRIGLYCERIDE OVER 400 mg/dL 16/06/9603   LDLDIRECT 53 01/27/2023   TRIG 568 (H) 01/27/2023   CHOLHDL 6.5 01/27/2023   Drugs of Abuse     Component Value Date/Time   LABOPIA NONE DETECTED 01/26/2023 0942   COCAINSCRNUR POSITIVE (A) 01/26/2023 0942   LABBENZ NONE DETECTED 01/26/2023 0942   AMPHETMU NONE DETECTED 01/26/2023 0942   THCU NONE DETECTED 01/26/2023 0942   LABBARB NONE DETECTED 01/26/2023 0942      Imaging: ECHOCARDIOGRAM COMPLETE  Result Date: 01/26/2023    ECHOCARDIOGRAM REPORT   Patient Name:   Russell Dawson Date of Exam: 01/26/2023 Medical Rec #:  [id number]   Height:       71.0 in Accession #:    [id number]  Weight:       229.9 lb Date of Birth:  17-Sep-1976  BSA:          2.237 m Patient Age:    46 years    BP:           131/83 mmHg Patient Gender: M           HR:           105 bpm. Exam Location:  Inpatient Procedure: 2D Echo, Cardiac Doppler and Color Doppler Indications:     chest pain. elevated troponin  History:         Patient has no prior history of Echocardiogram examinations.                  Signs/Symptoms:Chest Pain and Shortness of Breath; Risk                  Factors:Current Smoker.  Sonographer:     Delcie Roch RDCS Referring Phys:  [id number] Russell Lerner DO Diagnosing Phys: Russell Lerner DO IMPRESSIONS  1. Left ventricular ejection fraction, by estimation, is 60 to 65%. The left ventricle has normal function. The left ventricle has no regional wall motion abnormalities. Left ventricular diastolic parameters were normal.  2. Right ventricular systolic function is normal. The right ventricular size is normal. There is normal pulmonary artery systolic pressure. The estimated right ventricular systolic pressure is 28.8 mmHg.  3. The mitral valve is grossly normal. No evidence of mitral valve regurgitation. No evidence of mitral stenosis.  4. The aortic valve is tricuspid. Aortic valve regurgitation is not visualized. No aortic  stenosis is present.  5. The inferior vena cava is normal in size with greater than  50% respiratory variability, suggesting right atrial pressure of 3 mmHg. FINDINGS  Left Ventricle: Left ventricular ejection fraction, by estimation, is 60 to 65%. The left ventricle has normal function. The left ventricle has no regional wall motion abnormalities. The left ventricular internal cavity size was normal in size. There is  no left ventricular hypertrophy. Left ventricular diastolic parameters were normal. Right Ventricle: The right ventricular size is normal. No increase in right ventricular wall thickness. Right ventricular systolic function is normal. There is normal pulmonary artery systolic pressure. The tricuspid regurgitant velocity is 2.54 m/s, and  with an assumed right atrial pressure of 3 mmHg, the estimated right ventricular systolic pressure is 28.8 mmHg. Left Atrium: Left atrial size was normal in size. Right Atrium: Right atrial size was normal in size. Pericardium: There is no evidence of pericardial effusion. Mitral Valve: The mitral valve is grossly normal. No evidence of mitral valve regurgitation. No evidence of mitral valve stenosis. Tricuspid Valve: The tricuspid valve is normal in structure. Tricuspid valve regurgitation is trivial. No evidence of tricuspid stenosis. Aortic Valve: The aortic valve is tricuspid. Aortic valve regurgitation is not visualized. No aortic stenosis is present. Pulmonic Valve: The pulmonic valve was grossly normal. Pulmonic valve regurgitation is not visualized. No evidence of pulmonic stenosis. Aorta: The aortic root and ascending aorta are structurally normal, with no evidence of dilitation. Venous: The inferior vena cava is normal in size with greater than 50% respiratory variability, suggesting right atrial pressure of 3 mmHg. IAS/Shunts: The interatrial septum was not well visualized.  LEFT VENTRICLE PLAX 2D LVIDd:         4.50 cm   Diastology LVIDs:         3.00 cm    LV e' medial:    8.92 cm/s LV PW:         1.00 cm   LV E/e' medial:  7.9 LV IVS:        1.10 cm   LV e' lateral:   14.80 cm/s LVOT diam:     1.90 cm   LV E/e' lateral: 4.7 LV SV:         58 LV SV Index:   26 LVOT Area:     2.84 cm  RIGHT VENTRICLE             IVC RV Basal diam:  2.60 cm     IVC diam: 1.60 cm RV S prime:     18.00 cm/s TAPSE (M-mode): 1.6 cm LEFT ATRIUM             Index        RIGHT ATRIUM           Index LA diam:        3.30 cm 1.47 cm/m   RA Area:     12.70 cm LA Vol (A2C):   58.8 ml 26.28 ml/m  RA Volume:   27.40 ml  12.25 ml/m LA Vol (A4C):   45.2 ml 20.20 ml/m LA Biplane Vol: 51.4 ml 22.97 ml/m  AORTIC VALVE LVOT Vmax:   152.00 cm/s LVOT Vmean:  99.200 cm/s LVOT VTI:    0.203 m  AORTA Ao Root diam: 3.20 cm Ao Asc diam:  2.90 cm MITRAL VALVE               TRICUSPID VALVE MV Area (PHT): 4.86 cm    TR Peak grad:   25.8 mmHg MV Decel Time: 156 msec    TR Vmax:  254.00 cm/s MV E velocity: 70.20 cm/s MV A velocity: 66.00 cm/s  SHUNTS MV E/A ratio:  1.06        Systemic VTI:  0.20 m                            Systemic Diam: 1.90 cm Cyncere Ruhe DO Electronically signed by Russell Lerner DO Signature Date/Time: 01/26/2023/7:50:04 PM    Final    DG Chest Portable 1 View  Result Date: 01/26/2023 CLINICAL DATA:  Chest pains. EXAM: PORTABLE CHEST 1 VIEW COMPARISON:  PA Lat 04/07/2021, CTA chest 04/07/2021. FINDINGS: The heart size and mediastinal contours are within normal limits. Both lungs are clear. The visualized skeletal structures are unremarkable. IMPRESSION: No active disease.  Unchanged. Electronically Signed   By: Almira Bar M.D.   On: 01/26/2023 06:53    CARDIAC DATABASE: EKG: 01/26/2023: Sinus tachycardia, 140 bpm, poor R wave progression.   01/26/2023: Sinus tachycardia, 116 bpm, poor R wave progression.  Jan 27, 2023: Sinus rhythm, 93 bpm, LVH per voltage criteria, RSR prime, poor R wave progression, nonspecific T wave abnormality  Echocardiogram: 01/26/2023  1. Left  ventricular ejection fraction, by estimation, is 60 to 65%. The  left ventricle has normal function. The left ventricle has no regional  wall motion abnormalities. Left ventricular diastolic parameters were  normal.   2. Right ventricular systolic function is normal. The right ventricular  size is normal. There is normal pulmonary artery systolic pressure. The  estimated right ventricular systolic pressure is 28.8 mmHg.   3. The mitral valve is grossly normal. No evidence of mitral valve  regurgitation. No evidence of mitral stenosis.   4. The aortic valve is tricuspid. Aortic valve regurgitation is not  visualized. No aortic stenosis is present.   5. The inferior vena cava is normal in size with greater than 50%  respiratory variability, suggesting right atrial pressure of 3 mmHg.   Scheduled Meds:  folic acid  1 mg Oral Daily   multivitamin with minerals  1 tablet Oral Daily   mupirocin cream   Topical BID   thiamine  100 mg Oral Daily   Or   thiamine  100 mg Intravenous Daily    Continuous Infusions:  lactated ringers 150 mL/hr at 01/27/23 1054    PRN Meds: acetaminophen, ALPRAZolam, alum & mag hydroxide-simeth, LORazepam **OR** LORazepam, ondansetron (ZOFRAN) IV, oxyCODONE, traMADol   IMPRESSION & RECOMMENDATIONS: Russell Dawson is a 47 y.o. African-American male whose past medical history and cardiac risk factors include: hypertriglyceridemia, cocaine use, Cigarette smoker, alcohol use more than recommended amount, prediabetic, obesity.   Impression:  Precordial pain-improving. Elevated troponins likely secondary to supply demand ischemia-downtrending Cocaine use-positive UDS. Tachycardia-improving. AKI-resolved. Hypertriglyceridemia Rhabdomyolysis Cigarette smoker Alcohol use greater than recommended amount. Obesity due to excess calories  Recommendations: Precordial pain: Predominately noncardiac. Symptoms are not positional or pleuritic in nature. High sensitive  troponins peaked at 54, now downtrending. D-dimer negative low probability for PE/aortic syndrome. Echocardiogram notes preserved/hyperdynamic LVEF without regional wall motion abnormalities or significant valvular heart disease. Cardiovascular risk factors include: Prediabetic, obesity, cigarette smoking, hypertriglyceridemia. From a cardiovascular standpoint recommend improving his modifiable cardiovascular risk factors and once his rhabdomyolysis is resolved recommend outpatient exercise treadmill stress test and coronary calcium score for further risk stratification. However, offered the patient to undergo pharmacological stress if he believes he will NOT follow-up as outpatient either due to reduced motivation or it being cost prohibitive.  Patient  states that he rather do the stress test as outpatient once his acute illness is resolved.  However, if he changes his mind he will let us know. EKG is interpretable and patient is able to exercise; however, if he chooses to proceed with stress test inpatient recommend pharmacological stress to minimize worsening of his rhabdomyolysis. Risks, benefits, alternatives to stress test discussed.  Patient verbalizes understanding  Tachycardia: Improving. Telemetry reviewed independently no significant dysrhythmias. Avoid beta-blockers due to cocaine use Avoid dehydration, reduce alcohol consumption  Hypertriglyceridemia: Educated him on the importance of reducing foods high in carbohydrates and reducing alcohol consumption.  Will hold off on the addition of fenofibrate or pharmacological therapy for now given his rhabdomyolysis.  Cigarette smoking: Reemphasized importance of complete smoking cessation.  Alcohol consumption: Consumes at least 3-4 beers per day. Recommended consumption of no more than 2 standard alcoholic beverages per day  His blood pressure overall improving.  However may be above goal in the setting of recent cocaine exposure.  I  have asked him to keep a log of his blood pressures at home and to review with PCP to see if pharmacological therapy is warranted.  In the meantime reemphasized importance of a low-salt diet, keeping himself well-hydrated, reducing alcohol consumption as discussed above.  Patient's questions and concerns were addressed to his satisfaction. He voices understanding of the instructions provided during this encounter.   This note was created using a voice recognition software as a result there may be grammatical errors inadvertently enclosed that do not reflect the nature of this encounter. Every attempt is made to correct such errors.  Delilah Shan [facility] : [facility]  Pager:  [phone] Office: [phone] 01/27/2023, 5:11 PM

## 2023-01-27 NOTE — Progress Notes (Addendum)
PROGRESS NOTE Russell Dawson  ZOX:[id number] DOB: 07-24-1976 DOA: 01/26/2023 PCP: Russell Olszewski, MD  Brief Narrative/Hospital Course: 46yom w/ Diabetes came in with chest pain after running in a wound for an hour.  Patient reported that he was at someone's house and they were going to drive him so he started running, ran through the woods for approximately 1-1 and half hours and he started having chest tightness and also short of breath and sensation of heart racing so brought to the ED for evaluation Pain described as substernal in nature, persistent chest pain and tachycardic in 120s, ranging in 6/10.  His blood pressure is stable, lab work showed Acute renal failure creatinine 1.5 metabolic acidosis bicarb 16, troponin 34>42, EKG sinus tachycardia, D-dimer -0.3 lab with leukocytosis 7.5 and elevated RBC-likely reflecting hemoconcentration given 3 Liter bolus ivf, Ativan, fentanyl> subsequently aki resolved, WBC downtrending but patient persistently having tachycardia in 120s and chest pain.  Cardiology was consulted, admission requested for observation.  Of note he is UDS is positive for cocaine.  Subjective: Seen and examined this morning Alert awake oriented resting comfortably complains of bilateral chest discomfort 5/10 No nausea vomiting.   Assessment and Plan: Principal Problem:   Precordial chest pain Active Problems:   Elevated troponin   Cocaine abuse (HCC)   Tachycardia   AKI (acute kidney injury) (HCC)   Dehydration   Elevated CK   Prediabetes   Cigarette smoker   Alcohol abuse   Class 1 obesity due to excess calories without serious comorbidity with body mass index (BMI) of 32.0 to 32.9 in adult   Precordial pain    Bilateral chest tightness Elevated troponin flat-likely demand ischemia 42>54>49 UDS positive Sinus Tachycardia: Chest pain appears to be atypical, troponin slightly elevated but flat from demand ischemia.  Echo shows EF 60 to 65% no RWMA.Appreciate  cardiology input.  Discussed cessation of substance abuse UDS was positive for cocaine.  Avoid beta-blockers due to cocaine use.  Continue pain management.   Hypertriglyceridemia at 516 unable to tolerate LDL, diet control and follow-up with PCP  Leukocytosis: Likely reactive with hemoconcentration_improved  Dehydration/AKI/metabolic acidosis: Improved with IV fluids, continue IV fluids with ongoing rhabdomyolysis  Rhabdomyolysis exertional: ck rising- creat better- cont ivf, monitor ck Recent Labs  Lab 01/26/23 0937 01/27/23 0145  CKTOTAL 3,031* 5,504*    Alcohol abuse at risk of withdrawal add CIWA scale Ativan thiamine folate multivitamins cessation discussed  Prediabetes reports he was prescribed metformin to lose weight as well. hbA1c- pending. Cbg stabnle 132. . Class I Obesity:Patient's Body mass index is 32.07 kg/m. : Will benefit with PCP follow-up, weight loss  healthy lifestyle and outpatient sleep evaluation.   DVT prophylaxis: SCDs Start: 01/26/23 1144 Code Status:   Code Status: Full Code Family Communication: plan of care discussed with patient at bedside. Patient status is:  admitted as observation but remains hospitalized for ongoing  because of ongoing rhabdomyolysis and need for IV fluids Level of care: Telemetry Cardiac   Dispo: The patient is from: HOME            Anticipated disposition: HOME in 24 hr once ck better Objective: Vitals last 24 hrs: Vitals:   01/26/23 2033 01/26/23 2356 01/27/23 0523 01/27/23 1016  BP: 135/89 115/80 (!) 135/97 135/84  Pulse: (!) 112 90 87 93  Resp: 18 19 18 20   Temp: 98.2 F (36.8 C) 98 F (36.7 C) 98.4 F (36.9 C) 98.2 F (36.8 C)  TempSrc: Oral Oral Oral Oral  SpO2: 100% 100% 100%   Weight:      Height:       Weight change: -2.295 kg  Physical Examination:  General exam: alert awake, older than stated age HEENT:Oral mucosa moist, Ear/Nose WNL grossly Respiratory system: bilaterally clear BS, no use of  accessory muscle Cardiovascular system: S1 & S2 +, No JVD. Gastrointestinal system: Abdomen soft,NT,ND, BS+ Nervous System:Alert, awake, moving extremities. Extremities: LE edema neg,distal peripheral pulses palpable.  Skin: No rashes,no icterus. MSK: Normal muscle bulk,tone, power  Medications reviewed:  Scheduled Meds:  mupirocin cream   Topical BID   Continuous Infusions:  lactated ringers 150 mL/hr at 01/27/23 1054    Diet Order             Diet Heart Room service appropriate? Yes; Fluid consistency: Thin  Diet effective now                   Intake/Output Summary (Last 24 hours) at 01/27/2023 1144 Last data filed at 01/26/2023 1514 Gross per 24 hour  Intake 1048.43 ml  Output --  Net 1048.43 ml   Net IO Since Admission: 1,048.43 mL [01/27/23 1144]  Wt Readings from Last 3 Encounters:  01/26/23 104.3 kg  01/21/23 109.1 kg  10/03/22 105.2 kg     Unresulted Labs (From admission, onward)     Start     Ordered   01/27/23 1636  CK  Once,   R        01/27/23 1036   01/27/23 0500  CK  Daily,   R      01/26/23 1357   01/26/23 1145  Hemoglobin A1c  Add-on,   AD        01/26/23 1144          Data Reviewed: I have personally reviewed following labs and imaging studies CBC: Recent Labs  Lab 01/26/23 0611 01/26/23 0937 01/27/23 0954  WBC 17.5* 14.9* 6.9  NEUTROABS 14.2*  --   --   HGB 14.9 13.8 12.1*  HCT 45.6 40.1 36.6*  MCV 75.5* 74.7* 75.5*  PLT 331 282 217   Basic Metabolic Panel: Recent Labs  Lab 01/26/23 0611 01/26/23 0937 01/27/23 0954  NA 139 136 139  K 4.1 4.1 3.9  CL 104 102 104  CO2 16* 20* 26  GLUCOSE 161* 107* 132*  BUN 12 9 5*  CREATININE 1.57* 1.08 1.05  CALCIUM 10.3 9.2 8.8*  MG 3.5*  --   --   GFR: Estimated Creatinine Clearance: 110.3 mL/min (by C-G formula based on SCr of 1.05 mg/dL). Liver Function Tests: Recent Labs  Lab 01/27/23 0954  AST 64*  ALT 30  ALKPHOS 52  BILITOT 0.8  PROT 6.3*  ALBUMIN 3.5  Lipid  Profile: Recent Labs    01/27/23 0145  CHOL 149  HDL 23*  LDLCALC UNABLE TO CALCULATE IF TRIGLYCERIDE OVER 400 mg/dL  TRIG 829*  CHOLHDL 6.5  LDLDIRECT 53   Thyroid Function Tests: Recent Labs    01/26/23 0937  TSH 1.855   Sepsis Labs: No results for input(s): "PROCALCITON", "LATICACIDVEN" in the last 168 hours.  No results found for this or any previous visit (from the past 240 hour(s)).  Antimicrobials: Anti-infectives (From admission, onward)    None      Culture/Microbiology No results found for: "SDES", "SPECREQUEST", "CULT", "REPTSTATUS"  Radiology Studies: ECHOCARDIOGRAM COMPLETE  Result Date: 01/26/2023    ECHOCARDIOGRAM REPORT   Patient Name:   Russell Dawson Date of Exam: 01/26/2023 Medical  Rec #:  [id number]   Height:       71.0 in Accession #:    [id number]  Weight:       229.9 lb Date of Birth:  1976-02-03  BSA:          2.237 m Patient Age:    46 years    BP:           131/83 mmHg Patient Gender: M           HR:           105 bpm. Exam Location:  Inpatient Procedure: 2D Echo, Cardiac Doppler and Color Doppler Indications:     chest pain. elevated troponin  History:         Patient has no prior history of Echocardiogram examinations.                  Signs/Symptoms:Chest Pain and Shortness of Breath; Risk                  Factors:Current Smoker.  Sonographer:     Delcie Roch RDCS Referring Phys:  [id number] Tessa Lerner DO Diagnosing Phys: Tessa Lerner DO IMPRESSIONS  1. Left ventricular ejection fraction, by estimation, is 60 to 65%. The left ventricle has normal function. The left ventricle has no regional wall motion abnormalities. Left ventricular diastolic parameters were normal.  2. Right ventricular systolic function is normal. The right ventricular size is normal. There is normal pulmonary artery systolic pressure. The estimated right ventricular systolic pressure is 28.8 mmHg.  3. The mitral valve is grossly normal. No evidence of mitral valve regurgitation. No  evidence of mitral stenosis.  4. The aortic valve is tricuspid. Aortic valve regurgitation is not visualized. No aortic stenosis is present.  5. The inferior vena cava is normal in size with greater than 50% respiratory variability, suggesting right atrial pressure of 3 mmHg. FINDINGS  Left Ventricle: Left ventricular ejection fraction, by estimation, is 60 to 65%. The left ventricle has normal function. The left ventricle has no regional wall motion abnormalities. The left ventricular internal cavity size was normal in size. There is  no left ventricular hypertrophy. Left ventricular diastolic parameters were normal. Right Ventricle: The right ventricular size is normal. No increase in right ventricular wall thickness. Right ventricular systolic function is normal. There is normal pulmonary artery systolic pressure. The tricuspid regurgitant velocity is 2.54 m/s, and  with an assumed right atrial pressure of 3 mmHg, the estimated right ventricular systolic pressure is 28.8 mmHg. Left Atrium: Left atrial size was normal in size. Right Atrium: Right atrial size was normal in size. Pericardium: There is no evidence of pericardial effusion. Mitral Valve: The mitral valve is grossly normal. No evidence of mitral valve regurgitation. No evidence of mitral valve stenosis. Tricuspid Valve: The tricuspid valve is normal in structure. Tricuspid valve regurgitation is trivial. No evidence of tricuspid stenosis. Aortic Valve: The aortic valve is tricuspid. Aortic valve regurgitation is not visualized. No aortic stenosis is present. Pulmonic Valve: The pulmonic valve was grossly normal. Pulmonic valve regurgitation is not visualized. No evidence of pulmonic stenosis. Aorta: The aortic root and ascending aorta are structurally normal, with no evidence of dilitation. Venous: The inferior vena cava is normal in size with greater than 50% respiratory variability, suggesting right atrial pressure of 3 mmHg. IAS/Shunts: The  interatrial septum was not well visualized.  LEFT VENTRICLE PLAX 2D LVIDd:         4.50 cm   Diastology  LVIDs:         3.00 cm   LV e' medial:    8.92 cm/s LV PW:         1.00 cm   LV E/e' medial:  7.9 LV IVS:        1.10 cm   LV e' lateral:   14.80 cm/s LVOT diam:     1.90 cm   LV E/e' lateral: 4.7 LV SV:         58 LV SV Index:   26 LVOT Area:     2.84 cm  RIGHT VENTRICLE             IVC RV Basal diam:  2.60 cm     IVC diam: 1.60 cm RV S prime:     18.00 cm/s TAPSE (M-mode): 1.6 cm LEFT ATRIUM             Index        RIGHT ATRIUM           Index LA diam:        3.30 cm 1.47 cm/m   RA Area:     12.70 cm LA Vol (A2C):   58.8 ml 26.28 ml/m  RA Volume:   27.40 ml  12.25 ml/m LA Vol (A4C):   45.2 ml 20.20 ml/m LA Biplane Vol: 51.4 ml 22.97 ml/m  AORTIC VALVE LVOT Vmax:   152.00 cm/s LVOT Vmean:  99.200 cm/s LVOT VTI:    0.203 m  AORTA Ao Root diam: 3.20 cm Ao Asc diam:  2.90 cm MITRAL VALVE               TRICUSPID VALVE MV Area (PHT): 4.86 cm    TR Peak grad:   25.8 mmHg MV Decel Time: 156 msec    TR Vmax:        254.00 cm/s MV E velocity: 70.20 cm/s MV A velocity: 66.00 cm/s  SHUNTS MV E/A ratio:  1.06        Systemic VTI:  0.20 m                            Systemic Diam: 1.90 cm Sunit Tolia DO Electronically signed by Tessa Lerner DO Signature Date/Time: 01/26/2023/7:50:04 PM    Final    DG Chest Portable 1 View  Result Date: 01/26/2023 CLINICAL DATA:  Chest pains. EXAM: PORTABLE CHEST 1 VIEW COMPARISON:  PA Lat 04/07/2021, CTA chest 04/07/2021. FINDINGS: The heart size and mediastinal contours are within normal limits. Both lungs are clear. The visualized skeletal structures are unremarkable. IMPRESSION: No active disease.  Unchanged. Electronically Signed   By: Almira Bar M.D.   On: 01/26/2023 06:53     LOS: 0 days   Lanae Boast, MD [facility]  01/27/2023, 11:44 AM

## 2023-01-28 ENCOUNTER — Telehealth: Payer: Self-pay

## 2023-01-28 DIAGNOSIS — R072 Precordial pain: Secondary | ICD-10-CM | POA: Diagnosis not present

## 2023-01-28 LAB — CK
Total CK: 3653 U/L — ABNORMAL HIGH (ref 49–397)
Total CK: 3743 U/L — ABNORMAL HIGH (ref 49–397)

## 2023-01-28 LAB — HEMOGLOBIN A1C
Hgb A1c MFr Bld: 5.3 % (ref 4.8–5.6)
Mean Plasma Glucose: 105 mg/dL

## 2023-01-28 NOTE — Progress Notes (Signed)
Cardiology will sign off. Outpatient f/u w/Dr. Odis Hollingshead 02/08/2023, pending complete resolution of rhabdomyolysis, will schedule outpatient exercise treadmill stress test and CT cardiac scoring scan after that.   Elder Negus, MD Pager: [phone] Office: [phone]

## 2023-01-28 NOTE — Plan of Care (Signed)

## 2023-01-28 NOTE — TOC Initial Note (Signed)
Transition of Care [facility]) - Initial/Assessment Note    Patient Details  Name: Russell Dawson MRN: [id number] Date of Birth: 06-28-1976  Transition of Care [facility]) CM/SW Contact:    Delilah Shan, LCSWA Phone Number: 01/28/2023, 10:25 AM  Clinical Narrative:                  CSW spoke with patient at bedside. Patient reports he comes from home alone. Patient reports plan at dc is for his girlfriend to pick him up. CSW received consult for substance use resources for patient. CSW offered patient outpatient substance use treatment services resources. Patient accepted. All questions answered. No further questions reported at this time.        Patient Goals and CMS Choice            Expected Discharge Plan and Services                                              Prior Living Arrangements/Services                       Activities of Daily Living Home Assistive Devices/Equipment: None ADL Screening (condition at time of admission) Patient's cognitive ability adequate to safely complete daily activities?: Yes Is the patient deaf or have difficulty hearing?: No Does the patient have difficulty seeing, even when wearing glasses/contacts?: No Does the patient have difficulty concentrating, remembering, or making decisions?: No Patient able to express need for assistance with ADLs?: Yes Does the patient have difficulty dressing or bathing?: No Independently performs ADLs?: Yes (appropriate for developmental age) Does the patient have difficulty walking or climbing stairs?: No Weakness of Legs: None Weakness of Arms/Hands: None  Permission Sought/Granted                  Emotional Assessment              Admission diagnosis:  Cocaine abuse (HCC) [F14.10] Chest pain, unspecified type [R07.9] Left-sided chest pain [R07.9] Patient Active Problem List   Diagnosis Date Noted   Left-sided chest pain 01/27/2023   Non-traumatic rhabdomyolysis 01/27/2023    Precordial chest pain 01/26/2023   Elevated troponin 01/26/2023   Cocaine abuse (HCC) 01/26/2023   Tachycardia 01/26/2023   AKI (acute kidney injury) (HCC) 01/26/2023   Dehydration 01/26/2023   Elevated CK 01/26/2023   Prediabetes 01/26/2023   Cigarette smoker 01/26/2023   Alcohol abuse 01/26/2023   Class 1 obesity due to excess calories without serious comorbidity with body mass index (BMI) of 32.0 to 32.9 in adult 01/26/2023   Precordial pain 01/26/2023   Dry mouth 01/21/2023   Low libido 01/21/2023   Erectile dysfunction 01/21/2023   Subclinical hypothyroidism 10/03/2022   Hyperlipidemia 10/03/2022   History of smoking 10/03/2022   Obesity (BMI 30-39.9) 10/03/2022   RBC microcytosis 10/03/2022   Eustachian tube dysfunction, bilateral 10/03/2022   Has health insurance with inadequate coverage of health expenses 10/03/2022   Sleep apnea 10/03/2022   Synovitis of hip 06/20/2021   PCP:  Lula Olszewski, MD Pharmacy:   [facility] [phone] - [place], [place] - [address] AT [facility] [address] [place] [address] Phone: [phone] Fax: [phone]  [id number] - [place], [place] - [address] AT [facility] [phone] [place] [address] Phone: [phone] Fax: [phone]  [facility]  - [facility] [address] [place] [address] Phone: [phone] Fax: [phone]  [facility] - [place], [place] - [address] [place] [address] [place] [address] Phone: [phone] Fax: [phone]     Social Determinants of Health (SDOH) Social History: SDOH Screenings   Food Insecurity: No Food Insecurity (01/26/2023)  Housing: Medium Risk (01/26/2023)  Transportation Needs: No Transportation Needs (01/26/2023)  Utilities: Not At Risk (01/26/2023)  Depression (PHQ2-9): Low Risk  (10/03/2022)  Tobacco Use:  Medium Risk (01/26/2023)   SDOH Interventions:     Readmission Risk Interventions     No data to display

## 2023-01-28 NOTE — Telephone Encounter (Signed)
Called patient for TOC no answer left a vm  

## 2023-01-28 NOTE — Progress Notes (Signed)
PROGRESS NOTE Russell Dawson  ZOX:[id number] DOB: 09/01/1976 DOA: 01/26/2023 PCP: Lula Olszewski, MD  Brief Narrative/Hospital Course: 46yom w/ Diabetes came in with chest pain after running in a wound for an hour.  Patient reported that he was at someone's house and they were going to drive him so he started running, ran through the woods for approximately 1-1 and half hours and he started having chest tightness and also short of breath and sensation of heart racing so brought to the ED for evaluation Pain described as substernal in nature, persistent chest pain and tachycardic in 120s, ranging in 6/10.  His blood pressure is stable, lab work showed Acute renal failure creatinine 1.5 metabolic acidosis bicarb 16, troponin 34>42, EKG sinus tachycardia, D-dimer -0.3 lab with leukocytosis 7.5 and elevated RBC-likely reflecting hemoconcentration given 3 Liter bolus ivf, Ativan, fentanyl> subsequently aki resolved, WBC downtrending but patient persistently having tachycardia in 120s and chest pain.  Cardiology was consulted, admission requested for observation.  Of note he is UDS is positive for cocaine.  Subjective: Seen examined this morning mild chest discomfort, heart rate improved, CK remains elevated, remains on IV fluid.    Assessment and Plan: Principal Problem:   Precordial chest pain Active Problems:   Elevated troponin   Cocaine abuse (HCC)   Tachycardia   AKI (acute kidney injury) (HCC)   Dehydration   Elevated CK   Prediabetes   Cigarette smoker   Alcohol abuse   Class 1 obesity due to excess calories without serious comorbidity with body mass index (BMI) of 32.0 to 32.9 in adult   Precordial pain   Left-sided chest pain   Non-traumatic rhabdomyolysis    Bilateral chest tightness Elevated troponin flat-likely demand ischemia 42>54>49 UDS positive Sinus Tachycardia: CXR stable,troponin slightly elevated but flat from demand ischemia.Echo shows EF 60 to 65% no RWMA.Appreciate  cardiology input planning for oupatient stress test.Discussed cessation of substance abuse UDS was positive for cocaine.  Avoid beta-blockers due to cocaine use.  Continue pain management.   Hypertriglyceridemia at 516 unable to tolerate LDL, diet control and follow-up with PCP  Leukocytosis: Likely reactive with hemoconcentration: resolved  Dehydration/AKI/metabolic acidosis:Improved with IV fluids. Cont ivf  Rhabdomyolysis exertional: ck remains up- cont on ivf. Ck in am Recent Labs  Lab 01/26/23 0937 01/27/23 0145 01/27/23 0954 01/28/23 0213 01/28/23 1236  CKTOTAL 3,031* 5,504* 4,314* 3,653* 3,743*     Alcohol abuse at risk of withdrawal no withdrawls. Conr CIWA scale Ativan thiamine folate multivitamins cessation discussed  Prediabetes history-A1c is normal prediabetes/diabetes ruled out follow-up with PCP . Class I Obesity:Patient's Body mass index is 32.07 kg/m. : Will benefit with PCP follow-up, weight loss  healthy lifestyle and outpatient sleep evaluation.   DVT prophylaxis: SCDs Start: 01/26/23 1144 Code Status:   Code Status: Full Code Family Communication: plan of care discussed with patient at bedside. Patient status is:  admitted as observation but remains hospitalized for ongoing  because of ongoing rhabdomyolysis and need for IV fluids Level of care: Telemetry Cardiac   Dispo: The patient is from: HOME            Anticipated disposition: HOME tomorrow if ck better Objective: Vitals last 24 hrs: Vitals:   01/27/23 2036 01/28/23 0028 01/28/23 0421 01/28/23 1241  BP: (!) 142/85 (!) 135/96 (!) 132/95 (!) 136/92  Pulse: 91 81 86 81  Resp: 18  18 17   Temp: 98.2 F (36.8 C) 98.1 F (36.7 C) (!) 97.5 F (36.4 C) 98.4 F (36.9  C)  TempSrc: Oral Oral Oral Oral  SpO2: 96% 100% 96%   Weight:      Height:       Weight change:   Physical Examination: General exam: AAox3, weak,older appearing HEENT:Oral mucosa moist, Ear/Nose WNL grossly, dentition  normal. Respiratory system: bilaterally clear BS, no use of accessory muscle Cardiovascular system: S1 & S2 +, regular rate, JVD neg. Gastrointestinal system: Abdomen soft,NT,ND,BS+ Nervous System:Alert, awake, moving extremities and grossly nonfocal Extremities: LE ankle edema neg, lower extremities warm Skin: No rashes,no icterus. MSK: Normal muscle bulk,tone, power   Medications reviewed:  Scheduled Meds:  folic acid  1 mg Oral Daily   multivitamin with minerals  1 tablet Oral Daily   mupirocin cream   Topical BID   thiamine  100 mg Oral Daily   Or   thiamine  100 mg Intravenous Daily   Continuous Infusions:  lactated ringers 150 mL/hr at 01/28/23 1332    Diet Order             Diet Heart Room service appropriate? Yes; Fluid consistency: Thin  Diet effective now                  No intake or output data in the 24 hours ending 01/28/23 1712  Net IO Since Admission: 1,048.43 mL [01/28/23 1712]  Wt Readings from Last 3 Encounters:  01/26/23 104.3 kg  01/21/23 109.1 kg  10/03/22 105.2 kg     Unresulted Labs (From admission, onward)     Start     Ordered   01/27/23 0500  CK  Daily,   R      01/26/23 1357          Data Reviewed: I have personally reviewed following labs and imaging studies CBC: Recent Labs  Lab 01/26/23 0611 01/26/23 0937 01/27/23 0954  WBC 17.5* 14.9* 6.9  NEUTROABS 14.2*  --   --   HGB 14.9 13.8 12.1*  HCT 45.6 40.1 36.6*  MCV 75.5* 74.7* 75.5*  PLT 331 282 217    Basic Metabolic Panel: Recent Labs  Lab 01/26/23 0611 01/26/23 0937 01/27/23 0954  NA 139 136 139  K 4.1 4.1 3.9  CL 104 102 104  CO2 16* 20* 26  GLUCOSE 161* 107* 132*  BUN 12 9 5*  CREATININE 1.57* 1.08 1.05  CALCIUM 10.3 9.2 8.8*  MG 3.5*  --   --    GFR: Estimated Creatinine Clearance: 110.3 mL/min (by C-G formula based on SCr of 1.05 mg/dL). Liver Function Tests: Recent Labs  Lab 01/27/23 0954  AST 64*  ALT 30  ALKPHOS 52  BILITOT 0.8  PROT 6.3*   ALBUMIN 3.5   Lipid Profile: Recent Labs    01/27/23 0145  CHOL 149  HDL 23*  LDLCALC UNABLE TO CALCULATE IF TRIGLYCERIDE OVER 400 mg/dL  TRIG 130*  CHOLHDL 6.5  LDLDIRECT 53    Thyroid Function Tests: Recent Labs    01/26/23 0937  TSH 1.855    Sepsis Labs: No results for input(s): "PROCALCITON", "LATICACIDVEN" in the last 168 hours.  No results found for this or any previous visit (from the past 240 hour(s)).  Antimicrobials: Anti-infectives (From admission, onward)    None      Culture/Microbiology No results found for: "SDES", "SPECREQUEST", "CULT", "REPTSTATUS"  Radiology Studies: ECHOCARDIOGRAM COMPLETE  Result Date: 01/26/2023    ECHOCARDIOGRAM REPORT   Patient Name:   Russell Dawson Date of Exam: 01/26/2023 Medical Rec #:  [id number]   Height:  71.0 in Accession #:    [id number]  Weight:       229.9 lb Date of Birth:  08-May-1976  BSA:          2.237 m Patient Age:    46 years    BP:           131/83 mmHg Patient Gender: M           HR:           105 bpm. Exam Location:  Inpatient Procedure: 2D Echo, Cardiac Doppler and Color Doppler Indications:     chest pain. elevated troponin  History:         Patient has no prior history of Echocardiogram examinations.                  Signs/Symptoms:Chest Pain and Shortness of Breath; Risk                  Factors:Current Smoker.  Sonographer:     Delcie Roch RDCS Referring Phys:  [id number] Tessa Lerner DO Diagnosing Phys: Tessa Lerner DO IMPRESSIONS  1. Left ventricular ejection fraction, by estimation, is 60 to 65%. The left ventricle has normal function. The left ventricle has no regional wall motion abnormalities. Left ventricular diastolic parameters were normal.  2. Right ventricular systolic function is normal. The right ventricular size is normal. There is normal pulmonary artery systolic pressure. The estimated right ventricular systolic pressure is 28.8 mmHg.  3. The mitral valve is grossly normal. No evidence of mitral  valve regurgitation. No evidence of mitral stenosis.  4. The aortic valve is tricuspid. Aortic valve regurgitation is not visualized. No aortic stenosis is present.  5. The inferior vena cava is normal in size with greater than 50% respiratory variability, suggesting right atrial pressure of 3 mmHg. FINDINGS  Left Ventricle: Left ventricular ejection fraction, by estimation, is 60 to 65%. The left ventricle has normal function. The left ventricle has no regional wall motion abnormalities. The left ventricular internal cavity size was normal in size. There is  no left ventricular hypertrophy. Left ventricular diastolic parameters were normal. Right Ventricle: The right ventricular size is normal. No increase in right ventricular wall thickness. Right ventricular systolic function is normal. There is normal pulmonary artery systolic pressure. The tricuspid regurgitant velocity is 2.54 m/s, and  with an assumed right atrial pressure of 3 mmHg, the estimated right ventricular systolic pressure is 28.8 mmHg. Left Atrium: Left atrial size was normal in size. Right Atrium: Right atrial size was normal in size. Pericardium: There is no evidence of pericardial effusion. Mitral Valve: The mitral valve is grossly normal. No evidence of mitral valve regurgitation. No evidence of mitral valve stenosis. Tricuspid Valve: The tricuspid valve is normal in structure. Tricuspid valve regurgitation is trivial. No evidence of tricuspid stenosis. Aortic Valve: The aortic valve is tricuspid. Aortic valve regurgitation is not visualized. No aortic stenosis is present. Pulmonic Valve: The pulmonic valve was grossly normal. Pulmonic valve regurgitation is not visualized. No evidence of pulmonic stenosis. Aorta: The aortic root and ascending aorta are structurally normal, with no evidence of dilitation. Venous: The inferior vena cava is normal in size with greater than 50% respiratory variability, suggesting right atrial pressure of 3 mmHg.  IAS/Shunts: The interatrial septum was not well visualized.  LEFT VENTRICLE PLAX 2D LVIDd:         4.50 cm   Diastology LVIDs:         3.00 cm  LV e' medial:    8.92 cm/s LV PW:         1.00 cm   LV E/e' medial:  7.9 LV IVS:        1.10 cm   LV e' lateral:   14.80 cm/s LVOT diam:     1.90 cm   LV E/e' lateral: 4.7 LV SV:         58 LV SV Index:   26 LVOT Area:     2.84 cm  RIGHT VENTRICLE             IVC RV Basal diam:  2.60 cm     IVC diam: 1.60 cm RV S prime:     18.00 cm/s TAPSE (M-mode): 1.6 cm LEFT ATRIUM             Index        RIGHT ATRIUM           Index LA diam:        3.30 cm 1.47 cm/m   RA Area:     12.70 cm LA Vol (A2C):   58.8 ml 26.28 ml/m  RA Volume:   27.40 ml  12.25 ml/m LA Vol (A4C):   45.2 ml 20.20 ml/m LA Biplane Vol: 51.4 ml 22.97 ml/m  AORTIC VALVE LVOT Vmax:   152.00 cm/s LVOT Vmean:  99.200 cm/s LVOT VTI:    0.203 m  AORTA Ao Root diam: 3.20 cm Ao Asc diam:  2.90 cm MITRAL VALVE               TRICUSPID VALVE MV Area (PHT): 4.86 cm    TR Peak grad:   25.8 mmHg MV Decel Time: 156 msec    TR Vmax:        254.00 cm/s MV E velocity: 70.20 cm/s MV A velocity: 66.00 cm/s  SHUNTS MV E/A ratio:  1.06        Systemic VTI:  0.20 m                            Systemic Diam: 1.90 cm Sunit Tolia DO Electronically signed by Tessa Lerner DO Signature Date/Time: 01/26/2023/7:50:04 PM    Final      LOS: 1 day   Lanae Boast, MD [facility]  01/28/2023, 5:12 PM

## 2023-01-28 NOTE — Discharge Summary (Signed)
Physician Discharge Summary  Russell Dawson ZOX:[id number] DOB: 1976-05-03 DOA: 01/26/2023  PCP: Russell Olszewski, MD  Admit date: 01/26/2023 Discharge date: 01/29/2023 Recommendations for Outpatient Follow-up:  Follow up with PCP in 1 weeks-call for appointment Please obtain BMP/CBC in one week Dr Marga Hoots for outpatient stress test  Discharge Dispo: home Discharge Condition: Stable Code Status:   Code Status: Full Code Diet recommendation:  Diet Order             Diet Heart Room service appropriate? Yes; Fluid consistency: Thin  Diet effective now                    Brief/Interim Summary: 47yom w/ Diabetes came in with chest pain after running in a wound for an hour.  Patient reported that he was at someone's house and they were going to drive him so he started running, ran through the woods for approximately 1-1 and half hours and he started having chest tightness and also short of breath and sensation of heart racing so brought to the ED for evaluation Pain described as substernal in nature, persistent chest pain and tachycardic in 120s, ranging in 6/10.  His blood pressure is stable, lab work showed Acute renal failure creatinine 1.5 metabolic acidosis bicarb 16, troponin 34>42, EKG sinus tachycardia, D-dimer -0.3 lab with leukocytosis 7.5 and elevated RBC-likely reflecting hemoconcentration given 3 Liter bolus ivf, Ativan, fentanyl> subsequently aki resolved, WBC downtrending but patient persistently having tachycardia in 120s and chest pain.  Cardiology was consulted, admission requested for observation.  Of note he is UDS is positive for cocaine.  Patient was managed for rhabdomyolysis echo obtained unremarkable subsequently his labs showed improvement heart rate improved.  Rhabdomyolysis nicely downtrending tolerating well renal function remains stable.  Cardiology planning for outpatient stress test evaluation and has been cleared for discharge.  Discharge Diagnoses:  Principal  Problem:   Precordial chest pain Active Problems:   Elevated troponin   Cocaine abuse (HCC)   Tachycardia   AKI (acute kidney injury) (HCC)   Dehydration   Elevated CK   Prediabetes   Cigarette smoker   Alcohol abuse   Class 1 obesity due to excess calories without serious comorbidity with body mass index (BMI) of 32.0 to 32.9 in adult   Precordial pain   Left-sided chest pain   Non-traumatic rhabdomyolysis  Bilateral chest tightness Elevated troponin flat-likely demand ischemia 42>54>49 UDS positive Sinus Tachycardia: Chest pain appears to be atypical, troponin slightly elevated but flat from demand ischemia.  Echo shows EF 60 to 65% no RWMA.Appreciate cardiology input.  Discussed cessation of substance abuse UDS was positive for cocaine.  Avoid beta-blockers due to cocaine use.  Continue pain management.    Hypertriglyceridemia at 516 unable to tolerate LDL, diet control and follow-up with PCP   Leukocytosis: Likely reactive with hemoconcentration_improved   Dehydration/AKI/metabolic acidosis: Improved with IV fluids, continue IV fluids with ongoing rhabdomyolysis   Rhabdomyolysis exertional: ck rising- creat better- cont ivf, monitor ck Recent Labs  Lab 01/26/23 0937 01/27/23 0145 01/27/23 0954 01/28/23 0213 01/28/23 1236 01/29/23 0718  CKTOTAL 3,031* 5,504* 4,314* 3,653* 3,743* 2,703*    Alcohol abuse at risk of withdrawal add CIWA scale Ativan thiamine folate multivitamins cessation discussed   Prediabetes reports he was prescribed metformin to lose weight as well. hbA1c- pending. Cbg stabnle 132. . Class I Obesity:Patient's Body mass index is 32.07 kg/m. : Will benefit with PCP follow-up, weight loss  healthy lifestyle and outpatient sleep evaluation  Consults: Cardiology Subjective: Alert awake oriented resting comfortably no complaint.  Ambulating. Reports he got a call from the cardiology office for follow-up appointment  Discharge Exam: Vitals:    01/29/23 0434 01/29/23 0514  BP: (!) 135/100 (!) 141/99  Pulse: 80   Resp: 18   Temp: 98 F (36.7 C)   SpO2: 97%    General: Pt is alert, awake, not in acute distress Cardiovascular: RRR, S1/S2 +, no rubs, no gallops Respiratory: CTA bilaterally, no wheezing, no rhonchi Abdominal: Soft, NT, ND, bowel sounds + Extremities: no edema, no cyanosis  Discharge Instructions  Discharge Instructions     Discharge instructions   Complete by: As directed    Follow-up with cardiology to consider outpatient stress test, follow-up with PCP and monitor blood pressure  Please call call MD or return to ER for similar or worsening recurring problem that brought you to hospital or if any fever,nausea/vomiting,abdominal pain, uncontrolled pain, chest pain,  shortness of breath or any other alarming symptoms.  Please follow-up your doctor as instructed in a week time and call the office for appointment.  Please avoid alcohol, smoking, or any other illicit substance and maintain healthy habits including taking your regular medications as prescribed.  You were cared for by a hospitalist during your hospital stay. If you have any questions about your discharge medications or the care you received while you were in the hospital after you are discharged, you can call the unit and ask to speak with the hospitalist on call if the hospitalist that took care of you is not available.  Once you are discharged, your primary care physician will handle any further medical issues. Please note that NO REFILLS for any discharge medications will be authorized once you are discharged, as it is imperative that you return to your primary care physician (or establish a relationship with a primary care physician if you do not have one) for your aftercare needs so that they can reassess your need for medications and monitor your lab values   Increase activity slowly   Complete by: As directed       Allergies as of 01/29/2023        Reactions   Tape Swelling   States allergic to apoxy        Medication List     STOP taking these medications    amoxicillin-clavulanate 875-125 MG tablet Commonly known as: AUGMENTIN   pseudoephedrine 120 MG 12 hr tablet Commonly known as: Sudafed 12 Hour   sildenafil 100 MG tablet Commonly known as: Viagra   Simply Saline 0.9 % Aers Generic drug: Saline       TAKE these medications    fluticasone 50 MCG/ACT nasal spray Commonly known as: FLONASE Place 2 sprays into both nostrils daily.   metFORMIN 500 MG tablet Commonly known as: GLUCOPHAGE Take 1 tablet (500 mg total) by mouth 2 (two) times daily with a meal.        Follow-up Information     Tolia, Sunit, DO Follow up on 02/08/2023.   Specialties: Cardiology, Vascular Surgery Why: 3:15 PM Contact information: [address] [place] [address] [phone]         Russell Olszewski, MD Follow up in 1 week(s).   Specialty: Internal Medicine Contact information: [address] [place] [address] [phone]                Allergies  Allergen Reactions   Tape Swelling  States allergic to apoxy    The results of significant diagnostics from this hospitalization (including imaging, microbiology, ancillary and laboratory) are listed below for reference.    Microbiology: No results found for this or any previous visit (from the past 240 hour(s)).  Procedures/Studies: ECHOCARDIOGRAM COMPLETE  Result Date: 01/26/2023    ECHOCARDIOGRAM REPORT   Patient Name:   Russell Dawson Date of Exam: 01/26/2023 Medical Rec #:  [id number]   Height:       71.0 in Accession #:    [id number]  Weight:       229.9 lb Date of Birth:  [birth year]  BSA:          2.237 m Patient Age:    46 years    BP:           131/83 mmHg Patient Gender: M           HR:           105 bpm. Exam Location:  Inpatient Procedure: 2D Echo, Cardiac Doppler and Color Doppler Indications:     chest pain. elevated  troponin  History:         Patient has no prior history of Echocardiogram examinations.                  Signs/Symptoms:Chest Pain and Shortness of Breath; Risk                  Factors:Current Smoker.  Sonographer:     Delcie Roch RDCS Referring Phys:  [id number] Tessa Lerner DO Diagnosing Phys: Tessa Lerner DO IMPRESSIONS  1. Left ventricular ejection fraction, by estimation, is 60 to 65%. The left ventricle has normal function. The left ventricle has no regional wall motion abnormalities. Left ventricular diastolic parameters were normal.  2. Right ventricular systolic function is normal. The right ventricular size is normal. There is normal pulmonary artery systolic pressure. The estimated right ventricular systolic pressure is 28.8 mmHg.  3. The mitral valve is grossly normal. No evidence of mitral valve regurgitation. No evidence of mitral stenosis.  4. The aortic valve is tricuspid. Aortic valve regurgitation is not visualized. No aortic stenosis is present.  5. The inferior vena cava is normal in size with greater than 50% respiratory variability, suggesting right atrial pressure of 3 mmHg. FINDINGS  Left Ventricle: Left ventricular ejection fraction, by estimation, is 60 to 65%. The left ventricle has normal function. The left ventricle has no regional wall motion abnormalities. The left ventricular internal cavity size was normal in size. There is  no left ventricular hypertrophy. Left ventricular diastolic parameters were normal. Right Ventricle: The right ventricular size is normal. No increase in right ventricular wall thickness. Right ventricular systolic function is normal. There is normal pulmonary artery systolic pressure. The tricuspid regurgitant velocity is 2.54 m/s, and  with an assumed right atrial pressure of 3 mmHg, the estimated right ventricular systolic pressure is 28.8 mmHg. Left Atrium: Left atrial size was normal in size. Right Atrium: Right atrial size was normal in size. Pericardium:  There is no evidence of pericardial effusion. Mitral Valve: The mitral valve is grossly normal. No evidence of mitral valve regurgitation. No evidence of mitral valve stenosis. Tricuspid Valve: The tricuspid valve is normal in structure. Tricuspid valve regurgitation is trivial. No evidence of tricuspid stenosis. Aortic Valve: The aortic valve is tricuspid. Aortic valve regurgitation is not visualized. No aortic stenosis is present. Pulmonic Valve: The pulmonic valve was grossly normal. Pulmonic valve regurgitation is not  visualized. No evidence of pulmonic stenosis. Aorta: The aortic root and ascending aorta are structurally normal, with no evidence of dilitation. Venous: The inferior vena cava is normal in size with greater than 50% respiratory variability, suggesting right atrial pressure of 3 mmHg. IAS/Shunts: The interatrial septum was not well visualized.  LEFT VENTRICLE PLAX 2D LVIDd:         4.50 cm   Diastology LVIDs:         3.00 cm   LV e' medial:    8.92 cm/s LV PW:         1.00 cm   LV E/e' medial:  7.9 LV IVS:        1.10 cm   LV e' lateral:   14.80 cm/s LVOT diam:     1.90 cm   LV E/e' lateral: 4.7 LV SV:         58 LV SV Index:   26 LVOT Area:     2.84 cm  RIGHT VENTRICLE             IVC RV Basal diam:  2.60 cm     IVC diam: 1.60 cm RV S prime:     18.00 cm/s TAPSE (M-mode): 1.6 cm LEFT ATRIUM             Index        RIGHT ATRIUM           Index LA diam:        3.30 cm 1.47 cm/m   RA Area:     12.70 cm LA Vol (A2C):   58.8 ml 26.28 ml/m  RA Volume:   27.40 ml  12.25 ml/m LA Vol (A4C):   45.2 ml 20.20 ml/m LA Biplane Vol: 51.4 ml 22.97 ml/m  AORTIC VALVE LVOT Vmax:   152.00 cm/s LVOT Vmean:  99.200 cm/s LVOT VTI:    0.203 m  AORTA Ao Root diam: 3.20 cm Ao Asc diam:  2.90 cm MITRAL VALVE               TRICUSPID VALVE MV Area (PHT): 4.86 cm    TR Peak grad:   25.8 mmHg MV Decel Time: 156 msec    TR Vmax:        254.00 cm/s MV E velocity: 70.20 cm/s MV A velocity: 66.00 cm/s  SHUNTS MV E/A  ratio:  1.06        Systemic VTI:  0.20 m                            Systemic Diam: 1.90 cm Sunit Tolia DO Electronically signed by Tessa Lerner DO Signature Date/Time: 01/26/2023/7:50:04 PM    Final    DG Chest Portable 1 View  Result Date: 01/26/2023 CLINICAL DATA:  Chest pains. EXAM: PORTABLE CHEST 1 VIEW COMPARISON:  PA Lat 04/07/2021, CTA chest 04/07/2021. FINDINGS: The heart size and mediastinal contours are within normal limits. Both lungs are clear. The visualized skeletal structures are unremarkable. IMPRESSION: No active disease.  Unchanged. Electronically Signed   By: Almira Bar M.D.   On: 01/26/2023 06:53    Labs: BNP (last 3 results) No results for input(s): "BNP" in the last 8760 hours. Basic Metabolic Panel: Recent Labs  Lab 01/26/23 0611 01/26/23 0937 01/27/23 0954  NA 139 136 139  K 4.1 4.1 3.9  CL 104 102 104  CO2 16* 20* 26  GLUCOSE 161* 107* 132*  BUN 12 9  5*  CREATININE 1.57* 1.08 1.05  CALCIUM 10.3 9.2 8.8*  MG 3.5*  --   --    Liver Function Tests: Recent Labs  Lab 01/27/23 0954  AST 64*  ALT 30  ALKPHOS 52  BILITOT 0.8  PROT 6.3*  ALBUMIN 3.5   No results for input(s): "LIPASE", "AMYLASE" in the last 168 hours. No results for input(s): "AMMONIA" in the last 168 hours. CBC: Recent Labs  Lab 01/26/23 0611 01/26/23 0937 01/27/23 0954  WBC 17.5* 14.9* 6.9  NEUTROABS 14.2*  --   --   HGB 14.9 13.8 12.1*  HCT 45.6 40.1 36.6*  MCV 75.5* 74.7* 75.5*  PLT 331 282 217   Cardiac Enzymes: Recent Labs  Lab 01/27/23 0145 01/27/23 0954 01/28/23 0213 01/28/23 1236 01/29/23 0718  CKTOTAL 5,504* 4,314* 3,653* 3,743* 2,703*  Hgb A1c Recent Labs    01/26/23 1439  HGBA1C 5.3   Lipid Profile Recent Labs    01/27/23 0145  CHOL 149  HDL 23*  LDLCALC UNABLE TO CALCULATE IF TRIGLYCERIDE OVER 400 mg/dL  TRIG 409*  CHOLHDL 6.5  LDLDIRECT 53   Recent Labs  Lab 01/26/23 0611 01/26/23 0937 01/27/23 0954  WBC 17.5* 14.9* 6.9    Microbiology No results found for this or any previous visit (from the past 240 hour(s)).  Time coordinating discharge: 25 minutes  SIGNED: Lanae Boast, MD  [facility] 01/29/2023, 11:30 AM  If 7PM-7AM, please contact night-coverage [email or web address]

## 2023-01-28 NOTE — Progress Notes (Signed)
   01/28/23 0945  Spiritual Encounters  Type of Visit Initial  Care provided to: Patient  Referral source Patient request;Nurse (RN/NT/LPN)  Reason for visit Advance directives (Patient expressed he does not recall requesting the Advance Direcitve. This Clinical research associate provided Proofreader education and the patient declined)  OnCall Visit No   This note was prepared by Deneen Harts, M.Div..  For questions please contact by phone [phone].

## 2023-01-29 DIAGNOSIS — R072 Precordial pain: Secondary | ICD-10-CM | POA: Diagnosis not present

## 2023-01-29 LAB — CK: Total CK: 2703 U/L — ABNORMAL HIGH (ref 49–397)

## 2023-01-29 MED ORDER — HYDRALAZINE HCL 25 MG PO TABS: 25.0000 mg | ORAL_TABLET | Freq: Four times a day (QID) | ORAL | Status: DC | PRN

## 2023-01-29 NOTE — Telephone Encounter (Signed)
Patient is still admitted in the hospital.  

## 2023-01-30 ENCOUNTER — Telehealth: Payer: Self-pay

## 2023-01-30 NOTE — Transitions of Care (Post Inpatient/ED Visit) (Signed)
   01/30/2023  Name: Russell Dawson MRN: [id number] DOB: August 02, 1976  Today's TOC FU Call Status: Today's TOC FU Call Status:: Unsuccessul Call (1st Attempt) Unsuccessful Call (1st Attempt) Date: 01/30/23  Attempted to reach the patient regarding the most recent Inpatient/ED visit.  Follow Up Plan: Additional outreach attempts will be made to reach the patient to complete the Transitions of Care (Post Inpatient/ED visit) call.   Signature  Abby Lelend Heinecke, CMA  [facility] Team

## 2023-02-01 ENCOUNTER — Telehealth: Payer: Self-pay

## 2023-02-01 NOTE — Telephone Encounter (Signed)
Called patient for Toc no answer

## 2023-02-08 ENCOUNTER — Ambulatory Visit: Payer: Managed Care, Other (non HMO) | Admitting: Cardiology

## 2023-02-13 ENCOUNTER — Ambulatory Visit: Payer: Managed Care, Other (non HMO) | Admitting: Cardiology

## 2023-03-05 ENCOUNTER — Encounter: Payer: Self-pay | Admitting: Internal Medicine

## 2023-03-05 ENCOUNTER — Ambulatory Visit (INDEPENDENT_AMBULATORY_CARE_PROVIDER_SITE_OTHER): Payer: Managed Care, Other (non HMO) | Admitting: Internal Medicine

## 2023-03-05 VITALS — BP 146/92 | HR 87 | Temp 98.2°F | Ht 71.0 in | Wt 239.4 lb

## 2023-03-05 DIAGNOSIS — F1090 Alcohol use, unspecified, uncomplicated: Secondary | ICD-10-CM

## 2023-03-05 DIAGNOSIS — R718 Other abnormality of red blood cells: Secondary | ICD-10-CM

## 2023-03-05 DIAGNOSIS — S36119D Unspecified injury of liver, subsequent encounter: Secondary | ICD-10-CM | POA: Diagnosis not present

## 2023-03-05 DIAGNOSIS — R7303 Prediabetes: Secondary | ICD-10-CM

## 2023-03-05 DIAGNOSIS — S36119A Unspecified injury of liver, initial encounter: Secondary | ICD-10-CM

## 2023-03-05 DIAGNOSIS — E038 Other specified hypothyroidism: Secondary | ICD-10-CM

## 2023-03-05 DIAGNOSIS — N179 Acute kidney failure, unspecified: Secondary | ICD-10-CM

## 2023-03-05 DIAGNOSIS — R748 Abnormal levels of other serum enzymes: Secondary | ICD-10-CM | POA: Diagnosis not present

## 2023-03-05 DIAGNOSIS — N521 Erectile dysfunction due to diseases classified elsewhere: Secondary | ICD-10-CM | POA: Diagnosis not present

## 2023-03-05 DIAGNOSIS — R7989 Other specified abnormal findings of blood chemistry: Secondary | ICD-10-CM

## 2023-03-05 HISTORY — DX: Unspecified injury of liver, initial encounter: S36.119A

## 2023-03-05 LAB — COMPREHENSIVE METABOLIC PANEL
ALT: 42 U/L (ref 0–53)
AST: 32 U/L (ref 0–37)
Albumin: 5.1 g/dL (ref 3.5–5.2)
Alkaline Phosphatase: 64 U/L (ref 39–117)
BUN: 10 mg/dL (ref 6–23)
CO2: 26 mEq/L (ref 19–32)
Calcium: 9.7 mg/dL (ref 8.4–10.5)
Chloride: 101 mEq/L (ref 96–112)
Creatinine, Ser: 0.94 mg/dL (ref 0.40–1.50)
GFR: 98.5 mL/min (ref >60.00)
Glucose, Bld: 99 mg/dL (ref 70–99)
Potassium: 4.2 mEq/L (ref 3.5–5.1)
Sodium: 136 mEq/L (ref 135–145)
Total Bilirubin: 0.6 mg/dL (ref 0.2–1.2)
Total Protein: 7.5 g/dL (ref 6.0–8.3)

## 2023-03-05 LAB — CK: Total CK: 309 U/L — ABNORMAL HIGH (ref 7–232)

## 2023-03-05 NOTE — Progress Notes (Signed)
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   152.00 cm/s LVOT Vmean:  99.200 cm/s LVOT VTI:    0.203 m  AORTA Ao Root diam: 3.20 cm Ao Asc diam:  2.90 cm MITRAL VALVE               TRICUSPID VALVE MV Area (PHT): 4.86 cm    TR Peak grad:   25.8 mmHg MV Decel Time: 156 msec    TR Vmax:        254.00 cm/s MV E velocity: 70.20 cm/s MV A velocity: 66.00 cm/s  SHUNTS MV E/A ratio:  1.06        Systemic VTI:  0.20 m                            Systemic Diam: 1.90 cm Sunit Tolia DO Electronically signed  by Tessa Lerner DO Signature Date/Time: 01/26/2023/7:50:04 PM    Final    DG Chest Portable 1 View  Result Date: 01/26/2023 CLINICAL DATA:  Chest pains. EXAM: PORTABLE CHEST 1 VIEW COMPARISON:  PA Lat 04/07/2021, CTA chest 04/07/2021. FINDINGS: The heart size and mediastinal contours are within normal limits. Both lungs are clear. The visualized skeletal structures are unremarkable. IMPRESSION: No active disease.  Unchanged. Electronically Signed   By: Almira Bar M.D.   On: 01/26/2023 06:53       Signed: Lula Olszewski, MD 03/05/2023 4:23 PM  This encounter employed real-time, collaborative documentation. The patient actively reviewed and updated their medical record on a shared screen, ensuring transparency and facilitating joint problem-solving for the problem list, overview, and plan. This approach promotes accurate, informed care. The treatment plan was discussed and reviewed in detail, including medication safety, potential side effects, and all patient questions. We confirmed understanding and comfort with the plan. Follow-up instructions were established, including contacting the  office for any concerns, returning if symptoms worsen, persist, or new symptoms develop, and precautions for potential emergency department visits. ----------------------------------------------------- Lula Olszewski, MD  03/05/2023 4:23 PM  [facility]:  [phone]

## 2023-03-05 NOTE — Assessment & Plan Note (Signed)
Ok restart viagra

## 2023-03-05 NOTE — Assessment & Plan Note (Signed)
Encouraged patient to to limit to 1 drink a day or less,

## 2023-03-05 NOTE — Patient Instructions (Addendum)
It was a pleasure seeing you today! Your health and satisfaction are our top priorities.   Russell Hew, MD  VISIT SUMMARY:  Dear Russell Dawson, during our recent visit, we discussed your recent hospitalization and the changes you've made to your lifestyle since then. We also talked about your concerns regarding your blood pressure, kidney and liver health, and your desire to continue your Viagra prescription.  YOUR PLAN:  -RHABDOMYOLYSIS: This is a condition where your muscles break down rapidly, which can harm your kidneys. We will recheck your CK levels, a marker of muscle breakdown, through lab work.  -ACUTE KIDNEY INJURY: This is a sudden episode of kidney failure or kidney damage. We will recheck your kidney function through lab work.  -LIVER INJURY: This refers to damage to your liver, often caused by excessive alcohol consumption. We will recheck your liver function through lab work.  -HYPERTENSION: This is a condition where your blood pressure is consistently too high. You are advised to monitor your blood pressure at home.  -HYPERTRIGLYCERIDEMIA: This is a condition where you have too many triglycerides, a type of fat, in your blood. We will recheck your triglyceride levels through lab work.  -PREDIABETES: This is a condition where your blood sugar levels are higher than normal, but not high enough to be diagnosed as diabetes. You will continue taking Metformin for this.  -TOBACCO USE: You've reported that you've stopped smoking, which is excellent for your health. We encourage you to continue abstaining from smoking.  -ERECTILE DYSFUNCTION: This is a condition where a man has difficulty getting or maintaining an erection. You will continue taking Viagra for this.  INSTRUCTIONS:  We encourage you to continue with your lifestyle modifications, including abstaining from alcohol and tobacco. We are considering a follow-up with Cardiology for a stress test due to your recently elevated  troponin levels. We will review your lab results via MyChart, and you are always welcome to schedule follow-up visits to support your healthy lifestyle changes. Next Steps:  [x]  Early Intervention: Schedule sooner appointment, call our on-call services, or go to emergency room if there is Increase in pain or discomfort New or worsening symptoms Sudden or severe changes in your health [x]  Flexible Follow-Up: We recommend a No follow-ups on file. for optimal routine care. This allows for progress monitoring and treatment adjustments. [x]  Preventive Care: Schedule your annual preventive care visit! It's typically covered by insurance and helps identify potential health issues early. [x]  Lab & X-ray Appointments: Incomplete tests scheduled today, or call to schedule. X-rays: [facility] (M-F, 8:30am-noon or 1pm-5pm). [x]  Medical Information Release: Sign a release form at front desk to obtain relevant medical information we don't have.  Making the Most of Our Focused (20 minute) Appointments:  [x]   Clearly state your top concerns at the beginning of the visit to focus our discussion [x]   If you anticipate you will need more time, please inform the front desk during scheduling - we can book multiple appointments in the same week. [x]   If you have transportation problems- use our convenient video appointments or ask about transportation support. [x]   We can get down to business faster if you use MyChart to update information before the visit and submit non-urgent questions before your visit. Thank you for taking the time to provide details through MyChart.  Let our nurse know and she can import this information into your encounter documents.  Arrival and Wait Times: [x]   Arriving on time ensures that everyone  receives prompt attention. [x]   Early morning (8a) and afternoon (1p) appointments tend to have shortest wait times. [x]   Unfortunately, we cannot delay appointments for late  arrivals or hold slots during phone calls.  Getting Answers and Following Up  [x]   Simple Questions & Concerns: For quick questions or basic follow-up after your visit, reach [place] at [phone] or MyChart messaging. [x]   Complex Concerns: If your concern is more complex, scheduling an appointment might be best. Discuss this with the staff to find the most suitable option. [x]   Lab & Imaging Results: We'll contact you directly if results are abnormal or you don't use MyChart. Most normal results will be on MyChart within 2-3 business days, with a review message from Dr. Jon Billings. Haven't heard back in 2 weeks? Need results sooner? Contact us at [phone]. [x]   Referrals: Our referral coordinator will manage specialist referrals. The specialist's office should contact you within 2 weeks to schedule an appointment. Call us if you haven't heard from them after 2 weeks.  Staying Connected  [x]   MyChart: Activate your MyChart for the fastest way to access results and message [place]. See the last page of this paperwork for instructions on how to activate.  Bring to Your Next Appointment  [x]   Medications: Please bring all your medication bottles to your next appointment to ensure we have an accurate record of your prescriptions. [x]   Health Diaries: If you're monitoring any health conditions at home, keeping a diary of your readings can be very helpful for discussions at your next appointment.  Billing  [x]   X-ray & Lab Orders: These are billed by separate companies. Contact the invoicing company directly for questions or concerns. [x]   Visit Charges: Discuss any billing inquiries with our administrative services team.  Your Satisfaction Matters  [x]   Share Your Experience: We strive for your satisfaction! If you have any complaints, or preferably compliments, please let Dr. Jon Billings know directly or contact our Practice Administrators, Edwena Felty or [employer], by asking at the front  desk.   Reviewing Your Records  [x]   Review this early draft of your clinical encounter notes below and the final encounter summary tomorrow on MyChart after its been completed.   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

## 2023-03-06 LAB — T4F: T4,Free (Direct): 0.95 ng/dL (ref 0.82–1.77)

## 2023-03-06 LAB — TSH RFX ON ABNORMAL TO FREE T4: TSH: 5.83 u[IU]/mL — ABNORMAL HIGH (ref 0.450–4.500)

## 2023-03-12 LAB — THALASSEMIA AND HEMOGLOBINOPATHY COMPREHENSIVE EVALUATION
Ferritin: 185 ng/mL (ref 38–380)
Fetal Hemoglobin Testing: 4.4 % — ABNORMAL HIGH (ref <2.0)
HCT: 45.3 % (ref 38.5–50.0)
Hemoglobin A2 - HGBRFX: 5.8 % — ABNORMAL HIGH (ref 2.0–3.2)
Hemoglobin: 13.9 g/dL (ref 13.2–17.1)
Hgb A: 89.8 % — ABNORMAL LOW (ref >96.0)
MCH: 24.9 pg — ABNORMAL LOW (ref 27.0–33.0)
MCHC: 30.7 g/dL — ABNORMAL LOW (ref 32.0–36.0)
MCV: 81.2 fL (ref 80.0–100.0)
RBC: 5.58 10*6/uL (ref 4.20–5.80)
RDW: 16.9 % — ABNORMAL HIGH (ref 11.0–15.0)

## 2023-03-12 LAB — TROPONIN I: Troponin I: <3 ng/L (ref <=47)

## 2023-03-12 NOTE — Progress Notes (Signed)
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  While we received some results, there's additional information that would be best discussed in person during a follow-up consultation.  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  However, due to a technical issue (we only received one blood sample tube), a more detailed analysis to identify the specific type of beta thalassemia mutation was not possible.  Why is this important?  Knowing the specific type of mutation helps determine the severity of the condition and its potential implications for your health.  For example, some people with the beta thalassemia trait experience no symptoms and lead healthy lives, while others might require occasional monitoring.  Next Steps:  To gain a clearer understanding and discuss the details of your results, I recommend scheduling a follow-up consultation with me. During this appointment, we can:  Review your test results in detail, including the elevated Hb A2 and the need for further testing. Discuss the importance of identifying the specific beta thalassemia mutation and its potential impact on your health. Schedule a repeat of the Beta Globin Complete Analysis (TC [phone]) to ensure we have a sufficient sample for definitive results. Address any questions or concerns you may have about beta thalassemia or the next steps. We're Here for You:  We understand this might be new information, and we want to assure you that we are here to support you.  In addition to the follow-up consultation, genetic counseling services are also available if you'd like more in-depth information about beta thalassemia and its implications for you and your family.  Please don't hesitate to call the office to schedule a follow-up appointment at your earliest  convenience. We look forward to discussing your results further and addressing any questions you have.  Sincerely,  Russell Olszewski, MD  03/12/2023 4:39 PM   [facility]:  [phone]

## 2023-03-14 ENCOUNTER — Encounter: Payer: Self-pay | Admitting: Internal Medicine

## 2023-03-14 ENCOUNTER — Ambulatory Visit (INDEPENDENT_AMBULATORY_CARE_PROVIDER_SITE_OTHER): Payer: Managed Care, Other (non HMO) | Admitting: Internal Medicine

## 2023-03-14 VITALS — BP 136/92 | HR 83 | Temp 98.4°F | Ht 71.0 in | Wt 240.6 lb

## 2023-03-14 DIAGNOSIS — Z8739 Personal history of other diseases of the musculoskeletal system and connective tissue: Secondary | ICD-10-CM

## 2023-03-14 DIAGNOSIS — E038 Other specified hypothyroidism: Secondary | ICD-10-CM

## 2023-03-14 DIAGNOSIS — Z0000 Encounter for general adult medical examination without abnormal findings: Secondary | ICD-10-CM

## 2023-03-14 DIAGNOSIS — D563 Thalassemia minor: Secondary | ICD-10-CM

## 2023-03-14 DIAGNOSIS — D582 Other hemoglobinopathies: Secondary | ICD-10-CM | POA: Diagnosis not present

## 2023-03-14 DIAGNOSIS — F1090 Alcohol use, unspecified, uncomplicated: Secondary | ICD-10-CM

## 2023-03-14 DIAGNOSIS — I10 Essential (primary) hypertension: Secondary | ICD-10-CM | POA: Diagnosis not present

## 2023-03-14 DIAGNOSIS — R519 Headache, unspecified: Secondary | ICD-10-CM

## 2023-03-14 DIAGNOSIS — N529 Male erectile dysfunction, unspecified: Secondary | ICD-10-CM

## 2023-03-14 HISTORY — DX: Essential (primary) hypertension: I10

## 2023-03-14 MED ORDER — ACAMPROSATE CALCIUM 333 MG PO TBEC
666.0000 mg | DELAYED_RELEASE_TABLET | Freq: Three times a day (TID) | ORAL | 11 refills | Status: DC
Start: 2023-03-14 — End: 2023-10-14

## 2023-03-14 MED ORDER — AMLODIPINE BESYLATE 5 MG PO TABS
5.0000 mg | ORAL_TABLET | Freq: Every day | ORAL | 3 refills | Status: DC
Start: 2023-03-14 — End: 2024-04-22

## 2023-03-14 NOTE — Assessment & Plan Note (Addendum)
Reviewed available data from patient and  BP Readings from Last 3 Encounters:  03/14/23 (!) 136/92  03/05/23 (!) 146/92  01/21/23 (!) 144/90   My individualized, goal average blood pressure for this patient, after considering the evidence for and against aggressive blood pressure goals as well as their past medical history and preferences, is 140/90 In my medical opinion, this problem is stable, borderline controlled , will start amlodipine Hypertension: He has persistent elevated blood pressure readings at home. We discussed the potential link to alcohol use and the possible need for medication if since alcohol cessation did not improve blood pressure. We will start amlodipine 5mg  daily, with a half dose for the first few days to monitor tolerance.  Information for patient review: Please limit and avoid: salt, alcohol, NSAIDS, excess body weight, smoking, stress, sedentary lifestyles The risks of poor control over time are FUTURE stroke and heart attacks, but if you have a blood pressure over 180 and any red flag symptoms(headache/shortness of breath/confusion/chest discomfort) you should go to the ER.  You are encouraged to do home blood pressure monitoring, at least as many times per week as blood pressure medications you are on.  For example, bring a diary with 3 home blood pressure readings per week to each visit if you are on 3 blood pressure medications.   If you are on more than 3 medication(s) or have certain risk factors, we should do a resistant hypertension workup See AFTER VISIT SUMMARY for addition educational information provided Please let me know in advance when you need medication(s) refills, consistently taking your medication is very important!

## 2023-03-14 NOTE — Progress Notes (Signed)
[facility]: [phone]   Date:   03/14/2023 PCP:    Russell Olszewski, MD   Today's Healthcare Provider: Lula Olszewski, MD   Assessment and Plan:    Russell Dawson was seen today for one week follow-up and medical management of chronic issues.  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 We discussed the potential implications, including the risk of severe beta thalassemia in offspring if his partner also carries the trait.  He wanted to know worst case scenario if he doesnt repeat the test.  We looked it up and its beta-thallasemia zero going undiagnosed. We will repeat hemoglobin electrophoresis to confirm the diagnosis.   Abnormal hemoglobin (HCC) -     Thalassemia and Hemoglobinopathy Comprehensive Evaluation  Hypertension, unspecified type Overview: Interim history from March 14, 2023:    Home readings:always high at home Not on medications  Patient reports taking current medications consistently and not experiencing any significant associated side effects or symptoms.  Lab Results  Component Value Date   NA 136 03/05/2023   K 4.2 03/05/2023   CREATININE 0.94 03/05/2023   GFR 98.50 03/05/2023     Assessment & Plan: Reviewed available data from patient and  BP Readings from Last 3 Encounters:  03/14/23 (!) 136/92  03/05/23 (!) 146/92  01/21/23 (!) 144/90   My individualized, goal average blood pressure for this patient, after considering the evidence for and against aggressive blood pressure goals as well as their past medical history and preferences, is 140/90 In my medical opinion, this problem is stable, borderline controlled , will start amlodipine Hypertension: He has persistent elevated blood pressure readings at home. We discussed the potential link to alcohol use and the possible need for medication if since alcohol cessation did not improve blood pressure. We will  start amlodipine 5mg  daily, with a half dose for the first few days to monitor tolerance.  Information for patient review: Please limit and avoid: salt, alcohol, NSAIDS, excess body weight, smoking, stress, sedentary lifestyles The risks of poor control over time are FUTURE stroke and heart attacks, but if you have a blood pressure over 180 and any red flag symptoms(headache/shortness of breath/confusion/chest discomfort) you should go to the ER.  You are encouraged to do home blood pressure monitoring, at least as many times per week as blood pressure medications you are on.  For example, bring a diary with 3 home blood pressure readings per week to each visit if you are on 3 blood pressure medications.   If you are on more than 3 medication(s) or have certain risk factors, we should do a resistant hypertension workup See AFTER VISIT SUMMARY for addition educational information provided Please let me know in advance when you need medication(s) refills, consistently taking your medication is very important!   Orders: -     amLODIPine Besylate; Take 1 tablet (5 mg total) by mouth daily.  Dispense: 90 tablet; Refill: 3  History of rhabdomyolysis Overview: Lab Results  Component Value Date   CKTOTAL 309 (H) 03/05/2023   CKTOTAL 2,703 (H) 01/29/2023   CKTOTAL 3,743 (H) 01/28/2023   CKTOTAL 3,653 (H) 01/28/2023   CKTOTAL 4,314 (H) 01/27/2023  Staying hydrated and avoiding muscle injury is important   Assessment & Plan: Resolving Updated problem overview for this problem to improve longitudinal management  Will change to history of rhabomyolysis and resolve from problem list.   Erectile dysfunction, unspecified erectile dysfunction type Overview: With  low libido   Assessment & Plan: Erectile Dysfunction: He reports improvement with medication. We will continue the current erectile dysfunction medication.   Subclinical hypothyroidism Overview:     Latest Ref Rng & Units 03/05/2023     9:04 AM 01/26/2023    9:37 AM 10/03/2022    2:31 PM  THYROID  TSH 0.450 - 4.500 uIU/mL 5.830  1.855  4.71   T4,Free (Direct) 0.82 - 1.77 ng/dL 4.78     G9,FAOZ(HYQMVH) 0.60 - 1.60 ng/dL   8.46    anti-TPO antibodies: N/A  anti-Tg antibodies: N/A  Anti-TSI antibodies: N/A   Family history of thyroid disease: Patient last evaluated for thyroid nodules: Last ultrasound for thyroid nodules: N/A  Last heart exam for rhythm check:    Assessment & Plan: Subclinical Hyperthyroidism: He has slightly elevated thyroid hormone levels with normal TSH. We discussed the potential need for future thyroid hormone supplementation if the condition worsens. We will repeat thyroid function tests in 6 months.   Healthcare maintenance Assessment & Plan: General Health Maintenance: We encouraged him to continue abstaining from alcohol and smoking, and to increase muscle mass through exercise. We will repeat the lipid panel due to high triglycerides and consider a referral to genetic counseling for further evaluation of beta thalassemia trait.    Alcohol intake above recommended sensible limits Overview: He has completely stopped drinking. Would resolve this but he would like to have something in case he gets cravings.  Assessment & Plan: He is 14 days sober but reports headaches since cessation. He plans to abstain completely in future.  We discussed cravings and potential medications including naltrexone, acamprosate, and gabapentin. He declined naltrexone and gabapentin, expressing concerns about replacing one addiction with another. We will start acamprosate as needed for cravings.  Orders: -     Acamprosate Calcium; Take 2 tablets (666 mg total) by mouth 3 (three) times daily with meals.  Dispense: 180 tablet; Refill: 11  Generalized headaches Assessment & Plan: Headaches: He reports headaches after eating. We discussed the potential link to sleep apnea and encouraged him to consider a CPAP  machine for suspected sleep apnea. Encouraged patient to continue avoid alcohol, even though these are worse since abstaining.            Clinical Presentation:    47 y.o. male here today for One week follow-up and Medical Management of Chronic Issues  AI-Extracted: Discussed the use of AI scribe software for clinical note transcription with the patient, who gave verbal consent to proceed.  History of Present Illness   The patient, with a history of alcohol use and smoking, presents with a chief complaint of persistent headaches, which he has noticed since he stopped drinking 14 days ago. The headaches are described as severe, often necessitating sleep, and are frequently triggered by eating. The patient is unsure if these headaches are related to his cessation of alcohol consumption or a potential issue with blood pressure.  In addition to the headaches, the patient has been experiencing elevated blood pressure readings at home, with systolic readings occasionally reaching 150-160 and diastolic readings consistently in the 90s. The patient has made attempts to modify his diet to manage his blood pressure, but he has not yet seen significant improvements.  The patient has recently made significant lifestyle changes, including cessation of alcohol and smoking. He reports being 14 days sober and has noticed some physical changes, such as clearer eyes. However, he has not noticed any weight loss yet. The patient  has expressed a desire to see the person he could become without the influence of alcohol, which has been a part of his life since his teenage years.  The patient also reports a history of hip pain, which has since resolved, and a diagnosis of Eustachian dysfunction. He has been prescribed medication for erectile dysfunction, which he reports is effective. The patient has been referred for a sleep study due to suspected sleep apnea, which he has not yet pursued.  In a recent blood work,  the patient was found to have high triglycerides and a small red blood cell size with normal iron levels. Genetic testing revealed elevated hemoglobin A2, suggesting a potential problem with beta hemoglobin. Further testing is being considered to determine the precise allele and potential health implications. The patient has expressed some concern about this, but is currently leaning towards waiting for the repeat test results before making a decision.  Overall, the patient is in the process of significant lifestyle changes and is dealing with multiple health concerns, including persistent headaches, elevated blood pressure, and potential beta thalassemia. He is actively engaged in his health management and is open to treatment options to improve his overall health and wellbeing.        Reviewed chart data: Active Ambulatory Problems    Diagnosis Date Noted   Subclinical hypothyroidism 10/03/2022   Hyperlipidemia 10/03/2022   History of smoking 10/03/2022   Obesity (BMI 30-39.9) 10/03/2022   RBC microcytosis 10/03/2022   Eustachian tube dysfunction, bilateral 10/03/2022   Has health insurance with inadequate coverage of health expenses 10/03/2022   Sleep apnea 10/03/2022   Low libido 01/21/2023   Erectile dysfunction 01/21/2023   Prediabetes 01/26/2023   Alcohol intake above recommended sensible limits 01/26/2023   Hypertension 03/14/2023   Beta thalassemia trait 03/16/2023   Healthcare maintenance 03/16/2023   Generalized headaches 03/16/2023   Resolved Ambulatory Problems    Diagnosis Date Noted   Synovitis of hip 06/20/2021   Dry mouth 01/21/2023   Precordial chest pain 01/26/2023   Elevated troponin 01/26/2023   Tachycardia 01/26/2023   AKI (acute kidney injury) (HCC) 01/26/2023   Dehydration 01/26/2023   History of rhabdomyolysis 01/26/2023   Cigarette smoker 01/26/2023   Precordial pain 01/26/2023   Left-sided chest pain 01/27/2023   Non-traumatic rhabdomyolysis  01/27/2023   Liver injury 03/05/2023   Past Medical History:  Diagnosis Date   Anxiety    Mixed hyperlipidemia 10/03/2022    Outpatient Medications Prior to Visit  Medication Sig   fluticasone (FLONASE) 50 MCG/ACT nasal spray Place 2 sprays into both nostrils daily.   metFORMIN (GLUCOPHAGE) 500 MG tablet Take 1 tablet (500 mg total) by mouth 2 (two) times daily with a meal.   No facility-administered medications prior to visit.         Clinical Data Analysis:   Physical Exam  BP (!) 136/92 (BP Location: Left Arm, Patient Position: Sitting)   Pulse 83   Temp 98.4 F (36.9 C) (Temporal)   Ht 5\' 11"  (1.803 m)   Wt 240 lb 10.1 oz (109.1 kg)   SpO2 96%   BMI 33.56 kg/m  Wt Readings from Last 10 Encounters:  03/14/23 240 lb 10.1 oz (109.1 kg)  03/05/23 239 lb 6.4 oz (108.6 kg)  01/21/23 240 lb 9.6 oz (109.1 kg)  10/03/22 232 lb (105.2 kg)  06/26/21 215 lb (97.5 kg)  06/20/21 215 lb (97.5 kg)  06/16/21 215 lb (97.5 kg)  04/07/21 211 lb (95.7 kg)  Vital signs reviewed.  Nursing notes reviewed. Weight trend reviewed. Abnormalities and Problem-Specific physical exam findings:    General Appearance:  No acute distress appreciable.   Well-groomed, healthy-appearing male.  Well proportioned with no abnormal fat distribution.  Good muscle tone. Skin: Clear and well-hydrated. Pulmonary:  Normal work of breathing at rest, no respiratory distress apparent. SpO2: 96 %  Musculoskeletal: All extremities are intact.  Neurological:  Awake, alert, oriented, and engaged.  No obvious focal neurological deficits or cognitive impairments.  Sensorium seems unclouded.   Speech is clear and coherent with logical content. Psychiatric:  Appropriate mood, pleasant and cooperative demeanor, thoughtful and engaged during the exam  Results Reviewed:    No results found for any visits on 03/14/23.  Office Visit on 03/05/2023  Component Date Value   Total CK 03/05/2023 309 (H)    Sodium 03/05/2023  136    Potassium 03/05/2023 4.2    Chloride 03/05/2023 101    CO2 03/05/2023 26    Glucose, Bld 03/05/2023 99    BUN 03/05/2023 10    Creatinine, Ser 03/05/2023 0.94    Total Bilirubin 03/05/2023 0.6    Alkaline Phosphatase 03/05/2023 64    AST 03/05/2023 32    ALT 03/05/2023 42    Total Protein 03/05/2023 7.5    Albumin 03/05/2023 5.1    GFR 03/05/2023 98.50    Calcium 03/05/2023 9.7    Troponin I 03/05/2023 <3    TSH 03/05/2023 5.830 (H)    Interpretation - HGBRFX 03/05/2023 see note    Reviewed By: 03/05/2023 see note    Hgb A 03/05/2023 89.8 (L)    Fetal Hemoglobin Testing 03/05/2023 4.4 (H)    Hemoglobin A2 - HGBRFX 03/05/2023 5.8 (H)    C-Z Electrophoresis 03/05/2023 Confirms    RBC 03/05/2023 5.58    Hemoglobin 03/05/2023 13.9    HCT 03/05/2023 45.3    MCV 03/05/2023 81.2    MCH 03/05/2023 24.9 (L)    MCHC 03/05/2023 30.7 (L)    RDW 03/05/2023 16.9 (H)    Ferritin 03/05/2023 185    T4,Free (Direct) 03/05/2023 0.95   Office Visit on 01/21/2023  Component Date Value   Testosterone 01/21/2023 195.68 (L)   Office Visit on 10/03/2022  Component Date Value   TSH 10/03/2022 4.71    Free T4 10/03/2022 0.61    Iron 10/03/2022 133    TIBC 10/03/2022 435 (H)    %SAT 10/03/2022 31    Ferritin 10/03/2022 182    No image results found.   ECHOCARDIOGRAM COMPLETE  Result Date: 01/26/2023    ECHOCARDIOGRAM REPORT   Patient Name:   Russell Dawson Date of Exam: 01/26/2023 Medical Rec #:  [id number]   Height:       71.0 in Accession #:    [id number]  Weight:       229.9 lb Date of Birth:  09-16-1976  BSA:          2.237 m Patient Age:    46 years    BP:           131/83 mmHg Patient Gender: M           HR:           105 bpm. Exam Location:  Inpatient Procedure: 2D Echo, Cardiac Doppler and Color Doppler Indications:     chest pain. elevated troponin  History:         Patient has no prior history of Echocardiogram examinations.  Signs/Symptoms:Chest Pain and Shortness of  Breath; Risk                  Factors:Current Smoker.  Sonographer:     Delcie Roch RDCS Referring Phys:  [id number] Tessa Lerner DO Diagnosing Phys: Tessa Lerner DO IMPRESSIONS  1. Left ventricular ejection fraction, by estimation, is 60 to 65%. The left ventricle has normal function. The left ventricle has no regional wall motion abnormalities. Left ventricular diastolic parameters were normal.  2. Right ventricular systolic function is normal. The right ventricular size is normal. There is normal pulmonary artery systolic pressure. The estimated right ventricular systolic pressure is 28.8 mmHg.  3. The mitral valve is grossly normal. No evidence of mitral valve regurgitation. No evidence of mitral stenosis.  4. The aortic valve is tricuspid. Aortic valve regurgitation is not visualized. No aortic stenosis is present.  5. The inferior vena cava is normal in size with greater than 50% respiratory variability, suggesting right atrial pressure of 3 mmHg. FINDINGS  Left Ventricle: Left ventricular ejection fraction, by estimation, is 60 to 65%. The left ventricle has normal function. The left ventricle has no regional wall motion abnormalities. The left ventricular internal cavity size was normal in size. There is  no left ventricular hypertrophy. Left ventricular diastolic parameters were normal. Right Ventricle: The right ventricular size is normal. No increase in right ventricular wall thickness. Right ventricular systolic function is normal. There is normal pulmonary artery systolic pressure. The tricuspid regurgitant velocity is 2.54 m/s, and  with an assumed right atrial pressure of 3 mmHg, the estimated right ventricular systolic pressure is 28.8 mmHg. Left Atrium: Left atrial size was normal in size. Right Atrium: Right atrial size was normal in size. Pericardium: There is no evidence of pericardial effusion. Mitral Valve: The mitral valve is grossly normal. No evidence of mitral valve regurgitation. No  evidence of mitral valve stenosis. Tricuspid Valve: The tricuspid valve is normal in structure. Tricuspid valve regurgitation is trivial. No evidence of tricuspid stenosis. Aortic Valve: The aortic valve is tricuspid. Aortic valve regurgitation is not visualized. No aortic stenosis is present. Pulmonic Valve: The pulmonic valve was grossly normal. Pulmonic valve regurgitation is not visualized. No evidence of pulmonic stenosis. Aorta: The aortic root and ascending aorta are structurally normal, with no evidence of dilitation. Venous: The inferior vena cava is normal in size with greater than 50% respiratory variability, suggesting right atrial pressure of 3 mmHg. IAS/Shunts: The interatrial septum was not well visualized.  LEFT VENTRICLE PLAX 2D LVIDd:         4.50 cm   Diastology LVIDs:         3.00 cm   LV e' medial:    8.92 cm/s LV PW:         1.00 cm   LV E/e' medial:  7.9 LV IVS:        1.10 cm   LV e' lateral:   14.80 cm/s LVOT diam:     1.90 cm   LV E/e' lateral: 4.7 LV SV:         58 LV SV Index:   26 LVOT Area:     2.84 cm  RIGHT VENTRICLE             IVC RV Basal diam:  2.60 cm     IVC diam: 1.60 cm RV S prime:     18.00 cm/s TAPSE (M-mode): 1.6 cm LEFT ATRIUM  Index        RIGHT ATRIUM           Index LA diam:        3.30 cm 1.47 cm/m   RA Area:     12.70 cm LA Vol (A2C):   58.8 ml 26.28 ml/m  RA Volume:   27.40 ml  12.25 ml/m LA Vol (A4C):   45.2 ml 20.20 ml/m LA Biplane Vol: 51.4 ml 22.97 ml/m  AORTIC VALVE LVOT Vmax:   152.00 cm/s LVOT Vmean:  99.200 cm/s LVOT VTI:    0.203 m  AORTA Ao Root diam: 3.20 cm Ao Asc diam:  2.90 cm MITRAL VALVE               TRICUSPID VALVE MV Area (PHT): 4.86 cm    TR Peak grad:   25.8 mmHg MV Decel Time: 156 msec    TR Vmax:        254.00 cm/s MV E velocity: 70.20 cm/s MV A velocity: 66.00 cm/s  SHUNTS MV E/A ratio:  1.06        Systemic VTI:  0.20 m                            Systemic Diam: 1.90 cm Sunit Tolia DO Electronically signed by Tessa Lerner DO  Signature Date/Time: 01/26/2023/7:50:04 PM    Final    DG Chest Portable 1 View  Result Date: 01/26/2023 CLINICAL DATA:  Chest pains. EXAM: PORTABLE CHEST 1 VIEW COMPARISON:  PA Lat 04/07/2021, CTA chest 04/07/2021. FINDINGS: The heart size and mediastinal contours are within normal limits. Both lungs are clear. The visualized skeletal structures are unremarkable. IMPRESSION: No active disease.  Unchanged. Electronically Signed   By: Almira Bar M.D.   On: 01/26/2023 06:53       This encounter employed real-time, collaborative documentation. The patient actively reviewed and updated their medical record on a shared screen, ensuring transparency and facilitating joint problem-solving for the problem list, overview, and plan. This approach promotes accurate, informed care. The treatment plan was discussed and reviewed in detail, including medication safety, potential side effects, and all patient questions. We confirmed understanding and comfort with the plan. Follow-up instructions were established, including contacting the office for any concerns, returning if symptoms worsen, persist, or new symptoms develop, and precautions for potential emergency department visits. ----------------------------------------------------- Russell Olszewski, MD  03/16/2023 11:17 AM  [facility]:  [phone]

## 2023-03-16 ENCOUNTER — Encounter: Payer: Self-pay | Admitting: Internal Medicine

## 2023-03-16 DIAGNOSIS — Z0000 Encounter for general adult medical examination without abnormal findings: Secondary | ICD-10-CM | POA: Insufficient documentation

## 2023-03-16 DIAGNOSIS — R519 Headache, unspecified: Secondary | ICD-10-CM | POA: Insufficient documentation

## 2023-03-16 DIAGNOSIS — D563 Thalassemia minor: Secondary | ICD-10-CM | POA: Insufficient documentation

## 2023-03-16 NOTE — Assessment & Plan Note (Signed)
He is 14 days sober but reports headaches since cessation. He plans to abstain completely in future.  We discussed cravings and potential medications including naltrexone, acamprosate, and gabapentin. He declined naltrexone and gabapentin, expressing concerns about replacing one addiction with another. We will start acamprosate as needed for cravings.

## 2023-03-16 NOTE — Assessment & Plan Note (Signed)
Beta Thalassemia Trait: Elevated hemoglobin A2 suggests beta thalassemia trait. We discussed the potential implications, including the risk of severe beta thalassemia in offspring if Russell Dawson partner also carries the trait.  He wanted to know worst case scenario if he doesnt repeat the test.  We looked it up and its beta-thallasemia zero going undiagnosed. We will repeat hemoglobin electrophoresis to confirm the diagnosis.

## 2023-03-16 NOTE — Assessment & Plan Note (Signed)
General Health Maintenance: We encouraged him to continue abstaining from alcohol and smoking, and to increase muscle mass through exercise. We will repeat the lipid panel due to high triglycerides and consider a referral to genetic counseling for further evaluation of beta thalassemia trait.

## 2023-03-16 NOTE — Assessment & Plan Note (Signed)
Headaches: He reports headaches after eating. We discussed the potential link to sleep apnea and encouraged him to consider a CPAP machine for suspected sleep apnea. Encouraged patient to continue avoid alcohol, even though these are worse since abstaining.

## 2023-03-16 NOTE — Assessment & Plan Note (Signed)
Subclinical Hyperthyroidism: He has slightly elevated thyroid hormone levels with normal TSH. We discussed the potential need for future thyroid hormone supplementation if the condition worsens. We will repeat thyroid function tests in 6 months.

## 2023-03-16 NOTE — Assessment & Plan Note (Addendum)
Resolving Updated problem overview for this problem to improve longitudinal management  Will change to history of rhabomyolysis and resolve from problem list.

## 2023-03-16 NOTE — Patient Instructions (Addendum)
VISIT SUMMARY:  During your visit, we discussed your recent lifestyle changes, including quitting alcohol and smoking, and your ongoing health concerns. 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 We also discussed your erectile dysfunction, which seems to be improving with medication.  YOUR PLAN:   -HYPERTENSION: Your blood pressure readings have been high. We discussed how this might be linked to your past alcohol use and that medication might be needed if stopping alcohol doesn't lower your blood pressure. We're starting you on amlodipine 5mg  daily.  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 This doesn't need treatment right now, but we'll repeat the test to confirm the diagnosis and rule out serious variants.  -SUBCLINICAL HYPERTHYROIDISM: Your thyroid hormone levels are slightly high, but your TSH is normal. We'll repeat the thyroid function tests in 6 months to see if you need any treatment.  -HEADACHES: You've been having headaches after eating. We discussed how this might be linked to sleep apnea and suggested you consider a CPAP machine.  -ERECTILE DYSFUNCTION: Your erectile dysfunction seems to be improving with medication. We'll continue with the current medication.  INSTRUCTIONS:  Continue abstaining from alcohol and smoking. Try to increase your muscle mass through exercise. We'll repeat the lipid panel due to high triglycerides and consider referring you to genetic counseling for further evaluation of beta thalassemia trait if your repeat testing is significantly abnormal.   It was a pleasure seeing you today! Your health and satisfaction are our top priorities.   Glenetta Hew, MD  Next Steps:  [x]  Early Intervention: Schedule sooner appointment, call our on-call services, or go to emergency room if there is Increase in pain  or discomfort New or worsening symptoms Sudden or severe changes in your health [x]  Flexible Follow-Up: We recommend a No follow-ups on file. for optimal routine care. This allows for progress monitoring and treatment adjustments. [x]  Preventive Care: Schedule your annual preventive care visit! It's typically covered by insurance and helps identify potential health issues early. [x]  Lab & X-ray Appointments: Incomplete tests scheduled today, or call to schedule. X-rays: [facility] (M-F, 8:30am-noon or 1pm-5pm). [x]  Medical Information Release: Sign a release form at front desk to obtain relevant medical information we don't have.  Making the Most of Our Focused (20 minute) Appointments:  [x]   Clearly state your top concerns at the beginning of the visit to focus our discussion [x]   If you anticipate you will need more time, please inform the front desk during scheduling - we can book multiple appointments in the same week. [x]   If you have transportation problems- use our convenient video appointments or ask about transportation support. [x]   We can get down to business faster if you use MyChart to update information before the visit and submit non-urgent questions before your visit. Thank you for taking the time to provide details through MyChart.  Let our nurse know and she can import this information into your encounter documents.  Arrival and Wait Times: [x]   Arriving on time ensures that everyone receives prompt attention. [x]   Early morning (8a) and afternoon (1p) appointments tend to have shortest wait times. [x]   Unfortunately, we cannot delay appointments for late arrivals or hold slots during phone calls.  Getting Answers and Following Up  [x]   Simple Questions & Concerns: For quick questions or basic follow-up after your visit, reach [place] at [phone]  or MyChart messaging. [x]   Complex Concerns: If your concern is more complex, scheduling an appointment might be  best. Discuss this with the staff to find the most suitable option. [x]   Lab & Imaging Results: We'll contact you directly if results are abnormal or you don't use MyChart. Most normal results will be on MyChart within 2-3 business days, with a review message from Dr. Jon Billings. Haven't heard back in 2 weeks? Need results sooner? Contact us at [phone]. [x]   Referrals: Our referral coordinator will manage specialist referrals. The specialist's office should contact you within 2 weeks to schedule an appointment. Call us if you haven't heard from them after 2 weeks.  Staying Connected  [x]   MyChart: Activate your MyChart for the fastest way to access results and message [place]. See the last page of this paperwork for instructions on how to activate.  Bring to Your Next Appointment  [x]   Medications: Please bring all your medication bottles to your next appointment to ensure we have an accurate record of your prescriptions. [x]   Health Diaries: If you're monitoring any health conditions at home, keeping a diary of your readings can be very helpful for discussions at your next appointment.  Billing  [x]   X-ray & Lab Orders: These are billed by separate companies. Contact the invoicing company directly for questions or concerns. [x]   Visit Charges: Discuss any billing inquiries with our administrative services team.  Your Satisfaction Matters  [x]   Share Your Experience: We strive for your satisfaction! If you have any complaints, or preferably compliments, please let Dr. Jon Billings know directly or contact our Practice Administrators, Edwena Felty or [employer], by asking at the front desk.   Reviewing Your Records  [x]   Review this early draft of your clinical encounter notes below and the final encounter summary tomorrow on MyChart after its been completed.   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 We  discussed the potential implications, including the risk of severe beta thalassemia in offspring if his partner also carries the trait.  He wanted to know worst case scenario if he doesnt repeat the test.  We looked it up and its beta-thallasemia zero going undiagnosed. We will repeat hemoglobin electrophoresis to confirm the diagnosis.   Abnormal hemoglobin (HCC) -     Thalassemia and Hemoglobinopathy Comprehensive Evaluation  Hypertension, unspecified type Assessment & Plan: Reviewed available data from patient and  BP Readings from Last 3 Encounters:  03/14/23 (!) 136/92  03/05/23 (!) 146/92  01/21/23 (!) 144/90   My individualized, goal average blood pressure for this patient, after considering the evidence for and against aggressive blood pressure goals as well as their past medical history and preferences, is 140/90 In my medical opinion, this problem is stable, borderline controlled , will start amlodipine Hypertension: He has persistent elevated blood pressure readings at home. We discussed the potential link to alcohol use and the possible need for medication if since alcohol cessation did not improve blood pressure. We will start amlodipine 5mg  daily, with a half dose for the first few days to monitor tolerance.  Information for patient review: Please limit and avoid: salt, alcohol, NSAIDS, excess body weight, smoking, stress, sedentary lifestyles The risks of poor control over time are FUTURE stroke and heart attacks, but if you have a blood pressure over 180 and any red flag symptoms(headache/shortness of breath/confusion/chest discomfort) you should go to the ER.  You are encouraged to do home  blood pressure monitoring, at least as many times per week as blood pressure medications you are on.  For example, bring a diary with 3 home blood pressure readings per week to each visit if you are on 3 blood pressure medications.   If you are on more than 3 medication(s) or have certain  risk factors, we should do a resistant hypertension workup See AFTER VISIT SUMMARY for addition educational information provided Please let me know in advance when you need medication(s) refills, consistently taking your medication is very important!   Orders: -     amLODIPine Besylate; Take 1 tablet (5 mg total) by mouth daily.  Dispense: 90 tablet; Refill: 3  History of rhabdomyolysis Assessment & Plan: Resolving Updated problem overview for this problem to improve longitudinal management  Will change to history of rhabomyolysis and resolve from problem list.   Erectile dysfunction, unspecified erectile dysfunction type Assessment & Plan: Erectile Dysfunction: He reports improvement with medication. We will continue the current erectile dysfunction medication.   Subclinical hypothyroidism Assessment & Plan: Subclinical Hyperthyroidism: He has slightly elevated thyroid hormone levels with normal TSH. We discussed the potential need for future thyroid hormone supplementation if the condition worsens. We will repeat thyroid function tests in 6 months.   Healthcare maintenance Assessment & Plan: General Health Maintenance: We encouraged him to continue abstaining from alcohol and smoking, and to increase muscle mass through exercise. We will repeat the lipid panel due to high triglycerides and consider a referral to genetic counseling for further evaluation of beta thalassemia trait.    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 He plans to abstain completely in future.  We discussed cravings and potential medications including naltrexone, acamprosate, and gabapentin. He declined naltrexone and gabapentin, expressing concerns about replacing one addiction with another. We will start acamprosate as needed for cravings.  Orders: -     Acamprosate Calcium; Take 2 tablets (666 mg total) by mouth 3 (three)  times daily with meals.  Dispense: 180 tablet; Refill: 11  Generalized headaches Assessment & Plan: Headaches: He reports headaches after eating. We discussed the potential link to sleep apnea and encouraged him to consider a CPAP machine for suspected sleep apnea. Encouraged patient to continue avoid alcohol, even though these are worse since abstaining.

## 2023-03-16 NOTE — Assessment & Plan Note (Signed)
Erectile Dysfunction: He reports improvement with medication. We will continue the current erectile dysfunction medication.

## 2023-04-02 ENCOUNTER — Ambulatory Visit: Payer: Managed Care, Other (non HMO) | Admitting: Internal Medicine

## 2023-04-10 NOTE — Progress Notes (Signed)
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 Being a carrier means you have one altered gene but typically do not have any health problems related to this condition.  Next Steps:      Genetic Counseling: We recommend meeting with a genetic counselor. They can help you understand what being a carrier means for you and your family.  However, this is not mandatory or critical in your case.     Family Testing: It may be helpful for your family members to get tested to see if they are also carriers.     Health Monitoring: Regular check-ups with your healthcare provider to monitor your blood health are advised.  In particular we would like to monitor your red blood cell size and iron levels at least yearly.  Reproductive Choices:      If you and your partner are both carriers, there is a chance of having a child with beta thalassemia. Genetic counseling can provide you with information on your reproductive options, including prenatal testing.  Putting It in Perspective:     In a sense this is good news, because being a carrier of beta thalassemia usually does not cause health issues for you personally. However, it is important to know your carrier status for family planning and to inform your healthcare providers.  If you have any questions or would like to schedule an appointment with a genetic counselor, please contact us.

## 2023-04-15 LAB — THALASSEMIA AND HEMOGLOBINOPATHY COMPREHENSIVE EVALUATION
Ferritin: 174 ng/mL (ref 38–380)
Fetal Hemoglobin Testing: 5 % — ABNORMAL HIGH (ref <2.0)
HCT: 45 % (ref 38.5–50.0)
Hemoglobin A2 - HGBRFX: 5.8 % — ABNORMAL HIGH (ref 2.0–3.2)
Hemoglobin: 14.1 g/dL (ref 13.2–17.1)
Hgb A: 89.2 % — ABNORMAL LOW (ref >96.0)
MCH: 25.7 pg — ABNORMAL LOW (ref 27.0–33.0)
MCHC: 31.3 g/dL — ABNORMAL LOW (ref 32.0–36.0)
MCV: 82 fL (ref 80.0–100.0)
RBC: 5.49 10*6/uL (ref 4.20–5.80)
RDW: 16.9 % — ABNORMAL HIGH (ref 11.0–15.0)

## 2023-04-15 LAB — BETA-GLOBIN COMPLETE REFLEX

## 2023-04-15 LAB — GENO PHENO REVIEW

## 2023-06-18 ENCOUNTER — Telehealth: Payer: Managed Care, Other (non HMO) | Admitting: Physician Assistant

## 2023-06-18 ENCOUNTER — Ambulatory Visit
Admission: RE | Admit: 2023-06-18 | Discharge: 2023-06-18 | Disposition: A | Discharge status: Home / Self Care | Payer: Managed Care, Other (non HMO) | Source: Ambulatory Visit | Attending: Physician Assistant | Admitting: Physician Assistant

## 2023-06-18 VITALS — BP 113/71 | HR 86 | Temp 98.2°F | Resp 18 | Ht 71.0 in | Wt 237.0 lb

## 2023-06-18 DIAGNOSIS — R350 Frequency of micturition: Secondary | ICD-10-CM

## 2023-06-18 LAB — POCT URINALYSIS DIP (MANUAL ENTRY)
Bilirubin, UA: NEGATIVE
Blood, UA: NEGATIVE
Glucose, UA: NEGATIVE mg/dL
Ketones, POC UA: NEGATIVE mg/dL
Leukocytes, UA: NEGATIVE
Nitrite, UA: NEGATIVE
Protein Ur, POC: NEGATIVE mg/dL
Spec Grav, UA: <=1.005 — AB (ref 1.010–1.025)
Urobilinogen, UA: 0.2 E.U./dL
pH, UA: 6 (ref 5.0–8.0)

## 2023-06-18 NOTE — Progress Notes (Signed)
Virtual Visit Consent   Russell Dawson, you are scheduled for a virtual visit with a [facility] provider today. Just as with appointments in the office, your consent must be obtained to participate. Your consent will be active for this visit and any virtual visit you may have with one of our providers in the next 365 days. If you have a MyChart account, a copy of this consent can be sent to you electronically.  As this is a virtual visit, video technology does not allow for your provider to perform a traditional examination. This may limit your provider's ability to fully assess your condition. If your provider identifies any concerns that need to be evaluated in person or the need to arrange testing (such as labs, EKG, etc.), we will make arrangements to do so. Although advances in technology are sophisticated, we cannot ensure that it will always work on either your end or our end. If the connection with a video visit is poor, the visit may have to be switched to a telephone visit. With either a video or telephone visit, we are not always able to ensure that we have a secure connection.  By engaging in this virtual visit, you consent to the provision of healthcare and authorize for your insurance to be billed (if applicable) for the services provided during this visit. Depending on your insurance coverage, you may receive a charge related to this service.  I need to obtain your verbal consent now. Are you willing to proceed with your visit today? Russell Dawson has provided verbal consent on 06/18/2023 for a virtual visit (video or telephone). Piedad Climes, [place]  Date: 06/18/2023 10:52 AM  Virtual Visit via Video Note   I, Piedad Climes, connected with  Russell Dawson  ([id number], [birth year]) on 06/18/23 at 10:45 AM EDT by a video-enabled telemedicine application and verified that I am speaking with the correct person using two  identifiers.  Location: Patient: Virtual Visit Location Patient: Home Provider: Virtual Visit Location Provider: Home Office   I discussed the limitations of evaluation and management by telemedicine and the availability of in person appointments. The patient expressed understanding and agreed to proceed.    History of Present Illness: Russell Dawson is a 47 y.o. who identifies as a male who was assigned male at birth, and is being seen today for increase in urinary frequency. Notes after drinking something he has to run to the bathroom within 5 minutes or so. Notes this has been going on for 1.5-2 weeks and has not improved. Denies dysuria, hesitancy, hematuria. Denies fevers but notes mild chills. Denies nausea/vomiting. On Metformin for prediabetes. Denies any polydipsia. Endorses not eating the best diet. Notes remote history of UTI. Denies new sexual partner.   HPI: HPI  Problems:  Patient Active Problem List   Diagnosis Date Noted   Beta thalassemia trait 03/16/2023   Healthcare maintenance 03/16/2023   Generalized headaches 03/16/2023   Hypertension 03/14/2023   Prediabetes 01/26/2023   Alcohol intake above recommended sensible limits 01/26/2023   Low libido 01/21/2023   Erectile dysfunction 01/21/2023   Subclinical hypothyroidism 10/03/2022   Hyperlipidemia 10/03/2022   History of smoking 10/03/2022   Obesity (BMI 30-39.9) 10/03/2022   RBC microcytosis 10/03/2022   Eustachian tube dysfunction, bilateral 10/03/2022   Has health insurance with inadequate coverage of health expenses 10/03/2022   Sleep apnea 10/03/2022    Allergies:  Allergies  Allergen Reactions   Tape Swelling    States  allergic to apoxy   Medications:  Current Outpatient Medications:    acamprosate (CAMPRAL) 333 MG tablet, Take 2 tablets (666 mg total) by mouth 3 (three) times daily with meals., Disp: 180 tablet, Rfl: 11   amLODipine (NORVASC) 5 MG tablet, Take 1 tablet (5 mg total) by mouth  daily., Disp: 90 tablet, Rfl: 3   fluticasone (FLONASE) 50 MCG/ACT nasal spray, Place 2 sprays into both nostrils daily., Disp: 15.8 mL, Rfl: 11   metFORMIN (GLUCOPHAGE) 500 MG tablet, Take 1 tablet (500 mg total) by mouth 2 (two) times daily with a meal., Disp: 180 tablet, Rfl: 3  Observations/Objective: Patient is well-developed, well-nourished in no acute distress.  Resting comfortably  at home.  Head is normocephalic, atraumatic.  No labored breathing.  Speech is clear and coherent with logical content.  Patient is alert and oriented at baseline.   Assessment and Plan: 1. Urinary frequency  UTI versus other cause. Potentially change in diabetic status -- prediabetes to overt diabetes. Needs further assessment including exam and labs. Patient sent for in person evaluation and agrees to be seen ASAP today.  Follow Up Instructions: I discussed the assessment and treatment plan with the patient. The patient was provided an opportunity to ask questions and all were answered. The patient agreed with the plan and demonstrated an understanding of the instructions.  A copy of instructions were sent to the patient via MyChart unless otherwise noted below.   The patient was advised to call back or seek an in-person evaluation if the symptoms worsen or if the condition fails to improve as anticipated.  Time:  I spent 10 minutes with the patient via telehealth technology discussing the above problems/concerns.    Piedad Climes, PA-C

## 2023-06-18 NOTE — ED Triage Notes (Signed)
Note: Video Visit today re: Urinary frequency   Per Waldon Merl, PA-C; UTI versus other cause. Potentially change in diabetic status -- prediabetes to overt diabetes. Needs further assessment including exam and labs. Patient sent for in person evaluation and agrees to be seen ASAP today.   Symptoms that remain "peeing a lot, just about every 10 mins". No dysuria. No fever. No abd pain. No nausea or vomiting.

## 2023-06-18 NOTE — Patient Instructions (Signed)
  Orland Jarred, thank you for joining Piedad Climes, PA-C for today's virtual visit.  While this provider is not your primary care provider (PCP), if your PCP is located in our provider database this encounter information will be shared with them immediately following your visit.   A [facility] MyChart account gives you access to today's visit and all your visits, tests, and labs performed at [facility] " click here if you don't have a [facility] MyChart account or go to [email or web address]  Consent: (Patient) Russell Dawson provided verbal consent for this virtual visit at the beginning of the encounter.  Current Medications:  Current Outpatient Medications:    acamprosate (CAMPRAL) 333 MG tablet, Take 2 tablets (666 mg total) by mouth 3 (three) times daily with meals., Disp: 180 tablet, Rfl: 11   amLODipine (NORVASC) 5 MG tablet, Take 1 tablet (5 mg total) by mouth daily., Disp: 90 tablet, Rfl: 3   fluticasone (FLONASE) 50 MCG/ACT nasal spray, Place 2 sprays into both nostrils daily., Disp: 15.8 mL, Rfl: 11   metFORMIN (GLUCOPHAGE) 500 MG tablet, Take 1 tablet (500 mg total) by mouth 2 (two) times daily with a meal., Disp: 180 tablet, Rfl: 3   Medications ordered in this encounter:  No orders of the defined types were placed in this encounter.    *If you need refills on other medications prior to your next appointment, please contact your pharmacy*  Follow-Up: Call back or seek an in-person evaluation if the symptoms worsen or if the condition fails to improve as anticipated.  [facility] [phone]  Other Instructions   If you have been instructed to have an in-person evaluation today at a local Urgent Care facility, please use the link below. It will take you to a list of all of our available [facility], including address, phone number and hours of operation. Please do not delay care.  [facility]  If you or a family member do not have a primary care provider, use the link below to schedule a visit and establish care. When you choose a [facility] primary care physician or advanced practice provider, you gain a long-term partner in health. Find a Primary Care Provider  Learn more about [facility]'s in-office and virtual care options:  - Get Care Now

## 2023-06-24 NOTE — ED Provider Notes (Signed)
[facility]    CSN: [id number] Arrival date & time: 06/18/23  1618      History   Chief Complaint Chief Complaint  Patient presents with   UTI Symptoms    Appt    HPI Russell Dawson is a 47 y.o. male.   Patient here today for evaluation of urinary frequency.  He denies any dysuria and has not any fever.  He denies any abdominal pain.  He has not any nausea or vomiting.  He does not report back pain.  The history is provided by the patient.    Past Medical History:  Diagnosis Date   AKI (acute kidney injury) (HCC) 01/26/2023   Anxiety    Cigarette smoker 01/26/2023   Dry mouth 01/21/2023   Severe dry throat upon aweakening Seems due to chronic severe nasal obstructions while sleeping    Elevated troponin 01/26/2023   Erectile dysfunction 01/21/2023   Has health insurance with inadequate coverage of health expenses 10/03/2022   He is on a high deductible health savings account so that he would have to pay the entire cost of lab work to do additional labs for his problem of elevated thyroid microcytosis   History of rhabdomyolysis 01/26/2023   Lab Results  Component  Value  Date     CKTOTAL  309 (H)  03/05/2023     CKTOTAL  2,703 (H)  01/29/2023     CKTOTAL  3,743 (H)  01/28/2023     CKTOTAL  3,653 (H)  01/28/2023     CKTOTAL  4,314 (H)  01/27/2023  Staying hydrated and avoiding muscle injury is important      Hypertension 03/14/2023   Left-sided chest pain 01/27/2023   Liver injury 03/05/2023   Mixed hyperlipidemia 10/03/2022   Tg over 500    Non-traumatic rhabdomyolysis 01/27/2023   Precordial chest pain 01/26/2023   Precordial pain 01/26/2023   Synovitis of hip 06/20/2021   Tachycardia 01/26/2023    Patient Active Problem List   Diagnosis Date Noted   Beta thalassemia trait 03/16/2023   Healthcare maintenance 03/16/2023   Generalized headaches 03/16/2023   Hypertension 03/14/2023   Prediabetes 01/26/2023   Alcohol intake above  recommended sensible limits 01/26/2023   Low libido 01/21/2023   Erectile dysfunction 01/21/2023   Subclinical hypothyroidism 10/03/2022   Hyperlipidemia 10/03/2022   History of smoking 10/03/2022   Obesity (BMI 30-39.9) 10/03/2022   RBC microcytosis 10/03/2022   Eustachian tube dysfunction, bilateral 10/03/2022   Has health insurance with inadequate coverage of health expenses 10/03/2022   Sleep apnea 10/03/2022    History reviewed. No pertinent surgical history.     Home Medications    Prior to Admission medications   Medication Sig Start Date End Date Taking? Authorizing Provider  amLODipine (NORVASC) 5 MG tablet Take 1 tablet (5 mg total) by mouth daily. Patient taking differently: Take 5 mg by mouth daily. Haven't taken in 3-4 days. 03/14/23  Yes Lula Olszewski, MD  metFORMIN (GLUCOPHAGE) 500 MG tablet Take 1 tablet (500 mg total) by mouth 2 (two) times daily with a meal. Patient taking differently: Take 500 mg by mouth 2 (two) times daily with a meal. "Last taken maybe about a month ago" 10/03/22  Yes Lula Olszewski, MD  acamprosate (CAMPRAL) 333 MG tablet Take 2 tablets (666 mg total) by mouth 3 (three) times daily with meals. 03/14/23   Lula Olszewski, MD  fluticasone [facility]) 50 MCG/ACT nasal spray Place 2 sprays into both nostrils  daily. 10/03/22   Lula Olszewski, MD    Family History Family History  Problem Relation Age of Onset   Heart failure Mother    Diabetes Mother    Tuberculosis Mother    Dementia Father     Social History Social History   Tobacco Use   Smoking status: Former    Current packs/day: 0.00    Types: Cigarettes    Quit date: 04/03/2014    Years since quitting: 9.2   Smokeless tobacco: Never  Vaping Use   Vaping status: Never Used  Substance Use Topics   Alcohol use: Not Currently   Drug use: Never     Allergies   Tape   Review of Systems Review of Systems  Constitutional:  Negative for chills and fever.  Eyes:  Negative  for discharge and redness.  Respiratory:  Negative for shortness of breath.   Gastrointestinal:  Negative for abdominal pain, nausea and vomiting.  Genitourinary:  Positive for frequency. Negative for dysuria.     Physical Exam Triage Vital Signs ED Triage Vitals  Encounter Vitals Group     BP 06/18/23 1629 113/71     Systolic BP Percentile --      Diastolic BP Percentile --      Pulse Rate 06/18/23 1629 86     Resp 06/18/23 1629 18     Temp 06/18/23 1629 98.2 F (36.8 C)     Temp Source 06/18/23 1629 Oral     SpO2 06/18/23 1629 97 %     Weight 06/18/23 1626 237 lb (107.5 kg)     Height 06/18/23 1626 5\' 11"  (1.803 m)     Head Circumference --      Peak Flow --      Pain Score --      Pain Loc --      Pain Education --      Exclude from Growth Chart --    No data found.  Updated Vital Signs BP 113/71 (BP Location: Left Arm)   Pulse 86   Temp 98.2 F (36.8 C) (Oral)   Resp 18   Ht 5\' 11"  (1.803 m)   Wt 237 lb (107.5 kg)   SpO2 97%   BMI 33.05 kg/m      Physical Exam Vitals and nursing note reviewed.  Constitutional:      General: He is not in acute distress.    Appearance: Normal appearance. He is not ill-appearing.  HENT:     Head: Normocephalic and atraumatic.     Nose: Nose normal. No congestion or rhinorrhea.  Eyes:     Conjunctiva/sclera: Conjunctivae normal.  Cardiovascular:     Rate and Rhythm: Normal rate.  Pulmonary:     Effort: Pulmonary effort is normal. No respiratory distress.  Neurological:     Mental Status: He is alert.      UC Treatments / Results  Labs (all labs ordered are listed, but only abnormal results are displayed) Labs Reviewed  POCT URINALYSIS DIP (MANUAL ENTRY) - Abnormal; Notable for the following components:      Result Value   Color, UA light yellow (*)    Spec Grav, UA <=1.005 (*)    All other components within normal limits    EKG   Radiology No results found.  Procedures Procedures (including critical  care time)  Medications Ordered in UC Medications - No data to display  Initial Impression / Assessment and Plan / UC Course  I have reviewed the  triage vital signs and the nursing notes.  Pertinent labs & imaging results that were available during my care of the patient were reviewed by me and considered in my medical decision making (see chart for details).    UA without signs of infection or diabetes.  Discussed low specific gravity and question how much water patient is drinking.  Recommended further evaluation by PCP and possible urology if symptoms continue or worsen.  Patient expresses understanding.  Final Clinical Impressions(s) / UC Diagnoses   Final diagnoses:  Urinary frequency   Discharge Instructions   None    ED Prescriptions   None    PDMP not reviewed this encounter.   Tomi Bamberger, PA-C 06/24/23 [phone]

## 2023-08-12 ENCOUNTER — Telehealth: Payer: Managed Care, Other (non HMO) | Admitting: Physician Assistant

## 2023-08-12 DIAGNOSIS — R202 Paresthesia of skin: Secondary | ICD-10-CM

## 2023-08-12 DIAGNOSIS — M629 Disorder of muscle, unspecified: Secondary | ICD-10-CM

## 2023-08-12 DIAGNOSIS — R200 Anesthesia of skin: Secondary | ICD-10-CM | POA: Diagnosis not present

## 2023-08-12 MED ORDER — CYCLOBENZAPRINE HCL 10 MG PO TABS
5.0000 mg | ORAL_TABLET | Freq: Three times a day (TID) | ORAL | 0 refills | Status: DC | PRN
Start: 2023-08-12 — End: 2023-11-08

## 2023-08-12 MED ORDER — METHYLPREDNISOLONE 4 MG PO TBPK
ORAL_TABLET | ORAL | 0 refills | Status: DC
Start: 2023-08-12 — End: 2024-04-22

## 2023-08-12 NOTE — Patient Instructions (Signed)
Russell Dawson, thank you for joining [name], PA-C for today's virtual visit.  While this provider is not your primary care provider (PCP), if your PCP is located in our provider database this encounter information will be shared with them immediately following your visit.   A [place] MyChart account gives you access to today's visit and all your visits, tests, and labs performed at [facility] " click here if you don't have a [facility] MyChart account or go to [email or web address]  Consent: (Patient) Russell Dawson provided verbal consent for this virtual visit at the beginning of the encounter.  Current Medications:  Current Outpatient Medications:    cyclobenzaprine (FLEXERIL) 10 MG tablet, Take 0.5-1 tablets (5-10 mg total) by mouth 3 (three) times daily as needed., Disp: 30 tablet, Rfl: 0   methylPREDNISolone (MEDROL DOSEPAK) 4 MG TBPK tablet, 6 day taper; take as directed on package instructions, Disp: 21 tablet, Rfl: 0   acamprosate (CAMPRAL) 333 MG tablet, Take 2 tablets (666 mg total) by mouth 3 (three) times daily with meals., Disp: 180 tablet, Rfl: 11   amLODipine (NORVASC) 5 MG tablet, Take 1 tablet (5 mg total) by mouth daily. (Patient taking differently: Take 5 mg by mouth daily. Haven't taken in 3-4 days.), Disp: 90 tablet, Rfl: 3   fluticasone (FLONASE) 50 MCG/ACT nasal spray, Place 2 sprays into both nostrils daily., Disp: 15.8 mL, Rfl: 11   metFORMIN (GLUCOPHAGE) 500 MG tablet, Take 1 tablet (500 mg total) by mouth 2 (two) times daily with a meal. (Patient taking differently: Take 500 mg by mouth 2 (two) times daily with a meal. "Last taken maybe about a month ago"), Disp: 180 tablet, Rfl: 3   Medications ordered in this encounter:  Meds ordered this encounter  Medications   cyclobenzaprine (FLEXERIL) 10 MG tablet    Sig: Take 0.5-1 tablets (5-10 mg total) by mouth 3 (three) times daily as needed.    Dispense:  30 tablet     Refill:  0    Order Specific Question:   Supervising Provider    Answer:   Merrilee Jansky [[id number]]   methylPREDNISolone (MEDROL DOSEPAK) 4 MG TBPK tablet    Sig: 6 day taper; take as directed on package instructions    Dispense:  21 tablet    Refill:  0    Order Specific Question:   Supervising Provider    Answer:   Merrilee Jansky [[id number]]     *If you need refills on other medications prior to your next appointment, please contact your pharmacy*  Follow-Up: Call back or seek an in-person evaluation if the symptoms worsen or if the condition fails to improve as anticipated.  New  Virtual Care [phone]  Other Instructions Pinched Nerve A pinched nerve is an injury that occurs when too much pressure is placed on a nerve. This pressure can cause pain, burning, and muscle weakness in places that the nerve supplies feeling to, such as an arm, hand, or leg, or the back or neck. If a nerve is severely pinched or has been pinched for a long time, permanent nerve damage can occur. What are the causes? This condition may be caused by: A nerve passing through a narrow area between bones or other body structures. Arthritis that causes bones to press on a nerve. Loss of blood supply to a nerve. A nerve being stretched from an injury. A sudden injury with swelling. Long-term wear on the nerve. Age-related changes in the  spine. What are the signs or symptoms? The most common symptoms of a pinched nerve are feeling a tingling sensation and numbness. Other symptoms include: Pain that spreads from one area of the body part to another. A burning feeling. Muscle weakness. How is this diagnosed? This condition may be diagnosed based on: A physical exam. During the exam, your health care provider will: Check for numbness and muscle weakness. Move affected body parts to test for pain. X-rays to check for bone damage. An MRI or CT scan to check for conditions that may be  causing nerve damage. An electromyogram and nerve conduction study to evaluate how your muscles and nerves communicate. How is this treated? A pinched nerve is usually treated first by: Resting the affected body area. Using devices to help you move without pain (supportive or protective devices), such as a splint, brace, or neck collar. Other treatments depend on your symptoms and the amount of nerve damage you have. Other treatments may include: Medicines, such as: Injections of numbing medicine. NSAIDs. Pain medicines. Steroid medicines. These may be given as a pill or as an injection. Physical therapy to relieve pain, maintain movement, and improve muscle strength. Surgery. This may be done if other treatments do not work. Follow these instructions at home: If you have a removable collar, splint, or brace: Wear the collar, splint, or brace as told by your health care provider. Remove it only as told by your health care provider. Check the skin around the collar, splint, or brace every day. Tell your health care provider about any concerns. Loosen the collar, splint, or brace if any part of your body tingles, becomes numb, or turns cold and blue. Keep the collar, splint or brace clean. If the collar, splint, or brace is not waterproof: Do not let it get wet. Cover it with a watertight covering when you take a bath or shower. Ask your health care provider when it is safe to drive if you have a collar, splint, or brace. Managing pain and stiffness     If directed, put ice on the affected area. To do this: If you have a removable collar, splint, or brace, remove it as told by your health care provider. Put ice in a plastic bag. Place a towel between your skin and the bag. Leave the ice on for 20 minutes, 2-3 times a day. Remove the ice if your skin turns bright red. This is very important. If you cannot feel pain, heat, or cold, you have a greater risk of damage to the area. If  directed, apply heat to the affected area before you exercise. Use the heat source that your health care provider recommends, such as a moist heat pack or a heating pad. Place a towel between your skin and the heat source. Leave the heat on for 20-30 minutes. Remove the heat if your skin turns bright red. This is especially important if you are unable to feel pain, heat, or cold. You may have a greater risk of getting burned. General instructions Take over-the-counter and prescription medicines only as told by your health care provider. Wear supportive or protective devices as told by your health care provider. Do physical therapy exercises as directed by your health care provider or physical therapist. Return to your normal activities as told by your health care provider. Ask your health care provider what activities are safe for you. Rest as needed. Keep all follow-up visits. This is important. Where to find more information [employer]  of Neurological Disorders and Stroke: [employer] Contact a health care provider if: Your condition does not improve with treatment. Your pain, numbness, or weakness suddenly gets worse. You have new weakness in your arms or legs. Get help right away if you: Have loss of bladder control (urinary incontinence) or you cannot urinate. Cannot control bowel movements (fecal incontinence). These symptoms may be an emergency. Get help right away. Call 911. Do not wait to see if the symptoms will go away. Do not drive yourself to the hospital. Summary A pinched nerve is an injury that occurs when too much pressure is placed on a nerve. This pressure can cause pain, burning, and muscle weakness in places that the nerve supplies feeling to, such as an arm, hand, or leg, or the back or neck. Take over-the-counter and prescription medicines only as told by your health care provider. Ask your health care provider what activities are safe for you. This  information is not intended to replace advice given to you by your health care provider. Make sure you discuss any questions you have with your health care provider. Document Revised: 05/22/2021 Document Reviewed: 05/22/2021 Elsevier Patient Education  2024 Elsevier Inc.    If you have been instructed to have an in-person evaluation today at a local Urgent Care facility, please use the link below. It will take you to a list of all of our available [facility], including address, phone number and hours of operation. Please do not delay care.  [facility]  If you or a family member do not have a primary care provider, use the link below to schedule a visit and establish care. When you choose a [place] primary care physician or advanced practice provider, you gain a long-term partner in health. Find a Primary Care Provider  Learn more about [facility]'s in-office and virtual care options:  - Get Care Now

## 2023-08-12 NOTE — Progress Notes (Signed)
Virtual Visit Consent   Russell Dawson, you are scheduled for a virtual visit with a [facility] provider today. Just as with appointments in the office, your consent must be obtained to participate. Your consent will be active for this visit and any virtual visit you may have with one of our providers in the next 365 days. If you have a MyChart account, a copy of this consent can be sent to you electronically.  As this is a virtual visit, video technology does not allow for your provider to perform a traditional examination. This may limit your provider's ability to fully assess your condition. If your provider identifies any concerns that need to be evaluated in person or the need to arrange testing (such as labs, EKG, etc.), we will make arrangements to do so. Although advances in technology are sophisticated, we cannot ensure that it will always work on either your end or our end. If the connection with a video visit is poor, the visit may have to be switched to a telephone visit. With either a video or telephone visit, we are not always able to ensure that we have a secure connection.  By engaging in this virtual visit, you consent to the provision of healthcare and authorize for your insurance to be billed (if applicable) for the services provided during this visit. Depending on your insurance coverage, you may receive a charge related to this service.  I need to obtain your verbal consent now. Are you willing to proceed with your visit today? Russell Dawson has provided verbal consent on 08/12/2023 for a virtual visit (video or telephone). [name], PA-C  Date: 08/12/2023 12:09 PM  Virtual Visit via Video Note   I, [name], connected with  Russell Dawson  ([id number], 1976/04/05) on 08/12/23 at 12:00 PM EST by a video-enabled telemedicine application and verified that I am speaking with the correct person using two  identifiers.  Location: Patient: Virtual Visit Location Patient: Home Provider: Virtual Visit Location Provider: Home Office   I discussed the limitations of evaluation and management by telemedicine and the availability of in person appointments. The patient expressed understanding and agreed to proceed.    History of Present Illness: Russell Dawson is a 47 y.o. who identifies as a male who was assigned male at birth, and is being seen today for left arm numbness. Has been present for 2 weeks. No known injury. Feels numbness from upper back through left shoulder to fingers. No dropping of things or noted weakness. Does have some pulling through the upper back and left side of neck. Fiance could feel a muscle quiver about a week ago in the posterior upper arm. ROM is normal but does feel pain with movements. Raising arm (forward flexion and abduction) are worst.    Problems:  Patient Active Problem List   Diagnosis Date Noted   Beta thalassemia trait 03/16/2023   Healthcare maintenance 03/16/2023   Generalized headaches 03/16/2023   Hypertension 03/14/2023   Prediabetes 01/26/2023   Alcohol intake above recommended sensible limits 01/26/2023   Low libido 01/21/2023   Erectile dysfunction 01/21/2023   Subclinical hypothyroidism 10/03/2022   Hyperlipidemia 10/03/2022   History of smoking 10/03/2022   Obesity (BMI 30-39.9) 10/03/2022   RBC microcytosis 10/03/2022   Eustachian tube dysfunction, bilateral 10/03/2022   Has health insurance with inadequate coverage of health expenses 10/03/2022   Sleep apnea 10/03/2022    Allergies:  Allergies  Allergen Reactions   Tape  Swelling    States allergic to apoxy   Medications:  Current Outpatient Medications:    cyclobenzaprine (FLEXERIL) 10 MG tablet, Take 0.5-1 tablets (5-10 mg total) by mouth 3 (three) times daily as needed., Disp: 30 tablet, Rfl: 0   methylPREDNISolone (MEDROL DOSEPAK) 4 MG TBPK tablet, 6 day taper; take as  directed on package instructions, Disp: 21 tablet, Rfl: 0   acamprosate (CAMPRAL) 333 MG tablet, Take 2 tablets (666 mg total) by mouth 3 (three) times daily with meals., Disp: 180 tablet, Rfl: 11   amLODipine (NORVASC) 5 MG tablet, Take 1 tablet (5 mg total) by mouth daily. (Patient taking differently: Take 5 mg by mouth daily. Haven't taken in 3-4 days.), Disp: 90 tablet, Rfl: 3   fluticasone (FLONASE) 50 MCG/ACT nasal spray, Place 2 sprays into both nostrils daily., Disp: 15.8 mL, Rfl: 11   metFORMIN (GLUCOPHAGE) 500 MG tablet, Take 1 tablet (500 mg total) by mouth 2 (two) times daily with a meal. (Patient taking differently: Take 500 mg by mouth 2 (two) times daily with a meal. "Last taken maybe about a month ago"), Disp: 180 tablet, Rfl: 3   Observations/Objective: Patient is well-developed, well-nourished in no acute distress.  Resting comfortably at home.  Head is normocephalic, atraumatic.  No labored breathing. Speech is clear and coherent with logical content.  Patient is alert and oriented at baseline.    Assessment and Plan: 1. Musculoskeletal disorder involving upper trapezius muscle - cyclobenzaprine (FLEXERIL) 10 MG tablet; Take 0.5-1 tablets (5-10 mg total) by mouth 3 (three) times daily as needed.  Dispense: 30 tablet; Refill: 0 - methylPREDNISolone (MEDROL DOSEPAK) 4 MG TBPK tablet; 6 day taper; take as directed on package instructions  Dispense: 21 tablet; Refill: 0  2. Numbness and tingling in left arm  - Suspect trapezius strain or spasm - Failed NSAIDs - Add Medrol dose pack and flexeril - Avoid NSAIDs (Ibuprofen/Advil/Motrin or Naproxen/Aleve) while on steroid - Tylenol if okay for breakthrough pain - Heat to area - Epsom salt soak if able to get in and out of bath tub safely - Seek in person evaluation if worsening or fails to improve with treatment   Follow Up Instructions: I discussed the assessment and treatment plan with the patient. The patient was  provided an opportunity to ask questions and all were answered. The patient agreed with the plan and demonstrated an understanding of the instructions.  A copy of instructions were sent to the patient via MyChart unless otherwise noted below.    The patient was advised to call back or seek an in-person evaluation if the symptoms worsen or if the condition fails to improve as anticipated.    [name], PA-C

## 2023-10-01 ENCOUNTER — Other Ambulatory Visit: Payer: Self-pay | Admitting: Physician Assistant

## 2023-10-01 DIAGNOSIS — M629 Disorder of muscle, unspecified: Secondary | ICD-10-CM

## 2023-10-07 ENCOUNTER — Ambulatory Visit (HOSPITAL_BASED_OUTPATIENT_CLINIC_OR_DEPARTMENT_OTHER): Payer: Managed Care, Other (non HMO) | Attending: Urgent Care

## 2023-10-07 ENCOUNTER — Other Ambulatory Visit: Payer: Self-pay

## 2023-10-07 ENCOUNTER — Ambulatory Visit
Admission: RE | Admit: 2023-10-07 | Discharge: 2023-10-07 | Disposition: A | Discharge status: Home / Self Care | Payer: Managed Care, Other (non HMO) | Source: Ambulatory Visit | Attending: Family Medicine | Admitting: Family Medicine

## 2023-10-07 VITALS — BP 126/88 | HR 88 | Temp 97.9°F | Resp 16

## 2023-10-07 DIAGNOSIS — M25552 Pain in left hip: Secondary | ICD-10-CM | POA: Insufficient documentation

## 2023-10-07 MED ORDER — PREDNISONE 10 MG PO TABS: 30.0000 mg | ORAL_TABLET | Freq: Every day | ORAL | 0 refills | Status: DC

## 2023-10-07 NOTE — ED Triage Notes (Signed)
 Pt c/o pain to left hip x 2 weeks-denies injury-states he had a televisit and taking ibuprofen-limping gait-NAD

## 2023-10-07 NOTE — ED Provider Notes (Addendum)
 [facility]  Note:  This document was prepared using Conservation officer, historic buildings and may include unintentional dictation errors.  MRN: [id number] DOB: 01-29-1976  Subjective:   Russell Dawson is a 48 y.o. male presenting for 2-week history of acute onset persistent moderate to severe left hip pain.  Patient reports that the hip pain is internal, has difficulty bearing weight when it is severe.  He is able to walk otherwise.  Does not do heavy lifting.  No falls, trauma.  No hematuria.  No history of kidney stones.  Has previously had a hip issue over the right side about 2 years ago.  Had imaging done that was negative.  No current facility-administered medications for this encounter.  Current Outpatient Medications:    acamprosate  (CAMPRAL ) 333 MG tablet, Take 2 tablets (666 mg total) by mouth 3 (three) times daily with meals., Disp: 180 tablet, Rfl: 11   amLODipine  (NORVASC ) 5 MG tablet, Take 1 tablet (5 mg total) by mouth daily. (Patient taking differently: Take 5 mg by mouth daily. Haven't taken in 3-4 days.), Disp: 90 tablet, Rfl: 3   cyclobenzaprine  (FLEXERIL ) 10 MG tablet, Take 0.5-1 tablets (5-10 mg total) by mouth 3 (three) times daily as needed., Disp: 30 tablet, Rfl: 0   fluticasone  (FLONASE ) 50 MCG/ACT nasal spray, Place 2 sprays into both nostrils daily., Disp: 15.8 mL, Rfl: 11   metFORMIN  (GLUCOPHAGE ) 500 MG tablet, Take 1 tablet (500 mg total) by mouth 2 (two) times daily with a meal. (Patient taking differently: Take 500 mg by mouth 2 (two) times daily with a meal. Last taken maybe about a month ago), Disp: 180 tablet, Rfl: 3   methylPREDNISolone  (MEDROL  DOSEPAK) 4 MG TBPK tablet, 6 day taper; take as directed on package instructions, Disp: 21 tablet, Rfl: 0   Allergies  Allergen Reactions   Tape Swelling    States allergic to apoxy    Past Medical History:  Diagnosis Date   AKI (acute kidney injury) (HCC) 01/26/2023   Anxiety     Cigarette smoker 01/26/2023   Dry mouth 01/21/2023   Severe dry throat upon aweakening Seems due to chronic severe nasal obstructions while sleeping    Elevated troponin 01/26/2023   Erectile dysfunction 01/21/2023   Has health insurance with inadequate coverage of health expenses 10/03/2022   He is on a high deductible health savings account so that he would have to pay the entire cost of lab work to do additional labs for his problem of elevated thyroid  microcytosis   History of rhabdomyolysis 01/26/2023   Lab Results  Component  Value  Date     CKTOTAL  309 (H)  03/05/2023     CKTOTAL  2,703 (H)  01/29/2023     CKTOTAL  3,743 (H)  01/28/2023     CKTOTAL  3,653 (H)  01/28/2023     CKTOTAL  4,314 (H)  01/27/2023  Staying hydrated and avoiding muscle injury is important      Hypertension 03/14/2023   Left-sided chest pain 01/27/2023   Liver injury 03/05/2023   Mixed hyperlipidemia 10/03/2022   Tg over 500    Non-traumatic rhabdomyolysis 01/27/2023   Precordial chest pain 01/26/2023   Precordial pain 01/26/2023   Synovitis of hip 06/20/2021   Tachycardia 01/26/2023     History reviewed. No pertinent surgical history.  Family History  Problem Relation Age of Onset   Heart failure Mother    Diabetes Mother    Tuberculosis Mother  Dementia Father     Social History   Tobacco Use   Smoking status: Former    Current packs/day: 0.00    Types: Cigarettes    Quit date: 04/03/2014    Years since quitting: 9.5   Smokeless tobacco: Never  Vaping Use   Vaping status: Never Used  Substance Use Topics   Alcohol use: Not Currently   Drug use: Never    ROS   Objective:   Vitals: BP 126/88 (BP Location: Right Arm)   Pulse 88   Temp 97.9 F (36.6 C) (Oral)   Resp 16   SpO2 95%   Physical Exam Constitutional:      General: He is not in acute distress.    Appearance: Normal appearance. He is well-developed and normal weight. He is not ill-appearing, toxic-appearing or  diaphoretic.  HENT:     Head: Normocephalic and atraumatic.     Right Ear: External ear normal.     Left Ear: External ear normal.     Nose: Nose normal.     Mouth/Throat:     Pharynx: Oropharynx is clear.  Eyes:     General: No scleral icterus.       Right eye: No discharge.        Left eye: No discharge.     Extraocular Movements: Extraocular movements intact.  Cardiovascular:     Rate and Rhythm: Normal rate.  Pulmonary:     Effort: Pulmonary effort is normal.  Musculoskeletal:     Cervical back: Normal range of motion.     Left hip: Tenderness (over areas outlined) and bony tenderness present. No deformity, lacerations or crepitus. Decreased range of motion. Normal strength.       Legs:  Neurological:     Mental Status: He is alert and oriented to person, place, and time.  Psychiatric:        Mood and Affect: Mood normal.        Behavior: Behavior normal.        Thought Content: Thought content normal.        Judgment: Judgment normal.     Assessment and Plan :   PDMP not reviewed this encounter.  1. Left hip pain    Suspect hip bursitis, trochanteric bursitis.  Recommended prednisone  since he is not responding to NSAIDs.  Will pursue outpatient imaging as I do not have the radiology technologist onsite today.  X-ray over-read was pending at time of discharge, recommended follow up with only abnormal results. Otherwise will not call for negative over-read. Patient was in agreement. Counseled patient on potential for adverse effects with medications prescribed/recommended today, ER and return-to-clinic precautions discussed, patient verbalized understanding.   Christopher Savannah, [place] 10/07/23 [phone]

## 2023-10-07 NOTE — Discharge Instructions (Addendum)
 I have placed orders to have an x-ray done at the med center in [place].  Please check in through the emergency room but do not register to be seen as a patient.  They will contact the radiology department and a staff member will take you to go get the x-ray done. I will review x-ray results with you once I receive the report which usually takes about an hour after the x-ray is done.

## 2023-10-14 ENCOUNTER — Encounter: Payer: Self-pay | Admitting: Internal Medicine

## 2023-10-14 ENCOUNTER — Ambulatory Visit (INDEPENDENT_AMBULATORY_CARE_PROVIDER_SITE_OTHER): Payer: Managed Care, Other (non HMO) | Admitting: Internal Medicine

## 2023-10-14 VITALS — BP 130/88 | HR 76 | Temp 98.1°F | Ht 71.0 in | Wt 242.2 lb

## 2023-10-14 DIAGNOSIS — I10 Essential (primary) hypertension: Secondary | ICD-10-CM

## 2023-10-14 DIAGNOSIS — R718 Other abnormality of red blood cells: Secondary | ICD-10-CM

## 2023-10-14 DIAGNOSIS — Z1211 Encounter for screening for malignant neoplasm of colon: Secondary | ICD-10-CM

## 2023-10-14 DIAGNOSIS — R7303 Prediabetes: Secondary | ICD-10-CM | POA: Diagnosis not present

## 2023-10-14 DIAGNOSIS — Z0000 Encounter for general adult medical examination without abnormal findings: Secondary | ICD-10-CM | POA: Diagnosis not present

## 2023-10-14 DIAGNOSIS — E038 Other specified hypothyroidism: Secondary | ICD-10-CM

## 2023-10-14 DIAGNOSIS — Z789 Other specified health status: Secondary | ICD-10-CM

## 2023-10-14 DIAGNOSIS — Z87891 Personal history of nicotine dependence: Secondary | ICD-10-CM

## 2023-10-14 DIAGNOSIS — Z0001 Encounter for general adult medical examination with abnormal findings: Secondary | ICD-10-CM

## 2023-10-14 DIAGNOSIS — E782 Mixed hyperlipidemia: Secondary | ICD-10-CM

## 2023-10-14 DIAGNOSIS — E786 Lipoprotein deficiency: Secondary | ICD-10-CM

## 2023-10-14 DIAGNOSIS — R6882 Decreased libido: Secondary | ICD-10-CM

## 2023-10-14 DIAGNOSIS — Z9189 Other specified personal risk factors, not elsewhere classified: Secondary | ICD-10-CM

## 2023-10-14 DIAGNOSIS — M25552 Pain in left hip: Secondary | ICD-10-CM

## 2023-10-14 DIAGNOSIS — E669 Obesity, unspecified: Secondary | ICD-10-CM

## 2023-10-14 DIAGNOSIS — G4730 Sleep apnea, unspecified: Secondary | ICD-10-CM

## 2023-10-14 LAB — CBC WITH DIFFERENTIAL/PLATELET
Basophils Absolute: 0 10*3/uL (ref 0.0–0.1)
Basophils Relative: 0.4 % (ref 0.0–3.0)
Eosinophils Absolute: 0.1 10*3/uL (ref 0.0–0.7)
Eosinophils Relative: 1.8 % (ref 0.0–5.0)
HCT: 41.4 % (ref 39.0–52.0)
Hemoglobin: 13.1 g/dL (ref 13.0–17.0)
Lymphocytes Relative: 55.2 % — ABNORMAL HIGH (ref 12.0–46.0)
Lymphs Abs: 3.3 10*3/uL (ref 0.7–4.0)
MCHC: 31.6 g/dL (ref 30.0–36.0)
MCV: 74 fL — ABNORMAL LOW (ref 78.0–100.0)
Monocytes Absolute: 0.4 10*3/uL (ref 0.1–1.0)
Monocytes Relative: 7.2 % (ref 3.0–12.0)
Neutro Abs: 2.1 10*3/uL (ref 1.4–7.7)
Neutrophils Relative %: 35.4 % — ABNORMAL LOW (ref 43.0–77.0)
Platelets: 313 10*3/uL (ref 150.0–400.0)
RBC: 5.59 Mil/uL (ref 4.22–5.81)
RDW: 16.3 % — ABNORMAL HIGH (ref 11.5–15.5)
WBC: 6 10*3/uL (ref 4.0–10.5)

## 2023-10-14 LAB — COMPREHENSIVE METABOLIC PANEL
ALT: 22 U/L (ref 0–53)
AST: 19 U/L (ref 0–37)
Albumin: 4.5 g/dL (ref 3.5–5.2)
Alkaline Phosphatase: 82 U/L (ref 39–117)
BUN: 13 mg/dL (ref 6–23)
CO2: 27 meq/L (ref 19–32)
Calcium: 9.2 mg/dL (ref 8.4–10.5)
Chloride: 104 meq/L (ref 96–112)
Creatinine, Ser: 1.01 mg/dL (ref 0.40–1.50)
GFR: 89.98 mL/min (ref >60.00)
Glucose, Bld: 86 mg/dL (ref 70–99)
Potassium: 4.5 meq/L (ref 3.5–5.1)
Sodium: 139 meq/L (ref 135–145)
Total Bilirubin: 0.8 mg/dL (ref 0.2–1.2)
Total Protein: 6.3 g/dL (ref 6.0–8.3)

## 2023-10-14 LAB — LIPID PANEL
Cholesterol: 148 mg/dL (ref 0–200)
HDL: 20.7 mg/dL — ABNORMAL LOW (ref >39.00)
LDL Cholesterol: 78 mg/dL (ref 0–99)
NonHDL: 127.5
Total CHOL/HDL Ratio: 7
Triglycerides: 246 mg/dL — ABNORMAL HIGH (ref 0.0–149.0)
VLDL: 49.2 mg/dL — ABNORMAL HIGH (ref 0.0–40.0)

## 2023-10-14 LAB — TESTOSTERONE: Testosterone: 233.87 ng/dL — ABNORMAL LOW (ref 300.00–890.00)

## 2023-10-14 LAB — HEMOGLOBIN A1C: Hgb A1c MFr Bld: 6.3 % (ref 4.6–6.5)

## 2023-10-14 LAB — PSA: PSA: 0.51 ng/mL (ref 0.10–4.00)

## 2023-10-14 MED ORDER — CELECOXIB 200 MG PO CAPS
200.0000 mg | ORAL_CAPSULE | Freq: Two times a day (BID) | ORAL | 2 refills | Status: DC
Start: 2023-10-14 — End: 2024-04-22

## 2023-10-14 MED ORDER — PREDNISONE 20 MG PO TABS
ORAL_TABLET | ORAL | 0 refills | Status: DC
Start: 2023-10-14 — End: 2024-03-25

## 2023-10-14 NOTE — Assessment & Plan Note (Signed)
Hyperlipidemia Elevated triglycerides over 500 and very low HDL increase cardiovascular risk. Discussed the potential benefit of a CT scan to assess coronary artery disease and screen for lung cancer, noting cost and lack of insurance coverage. Plan: order CT scan of the heart, recheck cholesterol levels, and advise on dietary modifications to lower carbs and increase healthy fats like extra virgin olive oil.

## 2023-10-14 NOTE — Assessment & Plan Note (Signed)
Low Libido Reported low libido with potential causes discussed. Agreed to test testosterone levels. Plan: order testosterone level test.

## 2023-10-14 NOTE — Progress Notes (Signed)
[facility]: [phone]   -- Annual Preventive Medical Office Visit --  Patient:  Russell Dawson      Age: 48 y.o.       Sex:  male  Date:   10/14/2023 Patient Care Team: Lula Olszewski, MD as PCP - General (Internal Medicine) Today's Healthcare Provider: Lula Olszewski, MD  ========================================= Chief Complaint  Patient presents with   Annual Exam   Hip Pain    Purpose of Visit: Comprehensive preventive health assessment and personalized health maintenance planning.  This encounter was conducted as a Comprehensive Physical Exam (CPE) preventive care annual visit. The patient's medical history and problem list were reviewed to inform individualized preventive care recommendations.   In addition to usual Comprehensive Physical Exam (CPE) preventive care annual visit, patient requested evaluation of his left hip pain that has persisted and prednisone helped, but its not going away.    Assessment & Plan Screen for colon cancer  Encounter for annual health examination  Encounter for annual general medical examination with abnormal findings in adult Extensive anticipatory counseling given below and AVS  Generally healthy with a vegan diet, no smoking, and no alcohol use. Up to date on eye and dental exams. Discussed the importance of vitamin D and B12 supplementation due to a vegan diet. Recommended colon cancer screening with Cologuard and prostate cancer screening with PSA due to increased risk in the [facility] community. Explained PSA screening as a blood test for early prostate cancer detection. Plan: order annual blood work including cholesterol, thyroid, and diabetes screening, order Cologuard for colon cancer screening, order PSA for prostate cancer screening, and advise on vitamin D and B12 supplementation.  Follow-up Plan: follow up with orthopedist, schedule CT scan at [facility], and follow up in one year for  annual physical. Pain of left hip Left Hip Pain Chronic left hip pain, likely from a labral tear, severely affects mobility and daily function. Previous treatments with steroids and ibuprofen were ineffective. The persistence and severity of symptoms suggest a labral tear, though hip tendon or muscle strain is considered. Discussed the potential need for an MRI and possible surgical intervention. Explained risks of long-term steroid and ibuprofen use, recommending Celebrex for better gastrointestinal tolerance. Prefers to consult with an orthopedist before starting physical therapy. Plan: refer to orthopedist, prescribe Celebrex 200 mg, order steroid injection if needed, advise on MRI and possible surgery, and discuss physical therapy as a prerequisite for surgery. Vegan diet Encouraged patient to take vitamin B12 and vitamin D and iron. New change he has made Subclinical hypothyroidism  Sleep apnea, unspecified type  RBC microcytosis  Prediabetes Prediabetes Focus on weight management and dietary changes to prevent progression to diabetes. Emphasized the importance of weight loss and dietary modifications to manage blood glucose levels. Plan: recheck blood glucose levels and advise on weight loss and dietary modifications. Obesity (BMI 30-39.9)  Low libido Low Libido Reported low libido with potential causes discussed. Agreed to test testosterone levels. Plan: order testosterone level test. Low HDL (under 40)  At risk for heart disease  Mixed hyperlipidemia Hyperlipidemia Elevated triglycerides over 500 and very low HDL increase cardiovascular risk. Discussed the potential benefit of a CT scan to assess coronary artery disease and screen for lung cancer, noting cost and lack of insurance coverage. Plan: order CT scan of the heart, recheck cholesterol levels, and advise on dietary modifications to lower carbs and increase healthy fats like extra virgin olive oil. History of  smoking  Primary hypertension  Diagnoses and all orders for this visit: Screen for colon cancer -     Cologuard Encounter for annual health examination -     PSA -     Lipid panel -     Comprehensive metabolic panel -     CBC with Differential/Platelet -     TSH Rfx on Abnormal to Free T4 -     HgB A1c -     Cologuard Encounter for annual general medical examination with abnormal findings in adult Pain of left hip -     Ambulatory referral to Orthopedic Surgery -     predniSONE (DELTASONE) 20 MG tablet; Take 2 pills for 3 days, 1 pill for 4 days -     celecoxib (CELEBREX) 200 MG capsule; Take 1 capsule (200 mg total) by mouth 2 (two) times daily. Vegan diet Subclinical hypothyroidism Sleep apnea, unspecified type RBC microcytosis Prediabetes Obesity (BMI 30-39.9) Low libido -     Testosterone Low HDL (under 40) At risk for heart disease -     CT CARDIAC SCORING (SELF PAY ONLY); Future Mixed hyperlipidemia History of smoking Primary hypertension   Today's Health Maintenance Counseling and Anticipatory Guidance:  Eye exams:  We encouraged patient to to complete eye exam every 1-2 years.  He reports his last eye exam was:  within last year  Dental health:  He reports he has been keeping up with his dental visits.  He intends to do so in the future.  We encourage regular tooth brushing/flossing. Sinus health:  We encouraged sterile saline nasal misting sinus rinses daily for pollen, to reduce allergies and risk for sinus infections.   Sterile can based misting products are recommended due to superior misting and ease of maintaining sterility Sleep Apnea screening:  We encourage self monitoring of sleep quality with [employer] App and other tools/apps that are now available at retail he monitors his sleep with apple. Cardiovascular Risk Factor Reduction:   Advised patient of need for regular exercise and diet rich and fruits and vegetables and healthy fats to reduce risk of heart  attack and stroke.  Avoid first- and second-hand smoke and stimulants.   Avoid extreme exercise- exercise in moderation (150 minutes per week is a good goal) Discussed health benefits of physical activity, and encouraged him to engage in regular exercise appropriate for his age and condition. Exercise Activities and Dietary recommendations  Goals   None    Wt Readings from Last 3 Encounters:  10/14/23 242 lb 3.2 oz (109.9 kg)  06/18/23 237 lb (107.5 kg)  03/14/23 240 lb 10.1 oz (109.1 kg)  Body mass index is 33.78 kg/m. Health maintenance and immunizations reviewed and he was encouraged to complete anything that is due:  There is no immunization history on file for this patient. Health Maintenance Due  Topic Date Due   Colonoscopy  Never done   Health Maintenance  Topic Date Due   Colonoscopy  Never done   INFLUENZA VACCINE  12/23/2023 (Originally 04/25/2023)   HPV VACCINES  Aged Out   DTaP/Tdap/Td  Discontinued   COVID-19 Vaccine  Discontinued   Hepatitis C Screening  Discontinued   HIV Screening  Discontinued  Lifestyle / Social History Reviewed:  Social History   Tobacco Use   Smoking status: Former    Current packs/day: 0.00    Types: Cigarettes    Quit date: 04/03/2014    Years since quitting: 9.5   Smokeless tobacco: Never  Vaping Use  Vaping status: Never Used  Substance Use Topics   Alcohol use: Not Currently   Drug use: Never    My recommendation is total abstinence from all substances of abuse including smoke and 2nd hand smoke, alcohol, illicit drugs, smoking, inhalants, sugar, high risk sexual behavior Offered to assist with any use disorders or addictions.   Sexual transmitted infection testing offered today, but patient declined as he feels he is low risk based on his sexual history.   Social History   Substance and Sexual Activity  Sexual Activity Yes   Birth control/protection: None      10/14/2023    9:24 AM  Depression screen PHQ 2/9  Decreased  Interest 0  Down, Depressed, Hopeless 0  PHQ - 2 Score 0   Injury prevention: Discussed not texting and driving.  Today's Cancer Screening  Penile/Testicle/Scrotum cancer screening: Asked the patient to self-monitor for genital tumors. Patient reports there are none. Thyroid cancer screening: Advised to check by palpating thyroid for nodules Prostate cancer screening:  Denies family history of prostate cancer or hematospermia so too young for screening by current guidelines.(No results found for: "PSA") Colon cancer screening:   will do [employer] just turned 47 Lung cancer screening:  Current guidelines recommend Individuals aged 55 to 73 who currently smoke or formerly smoked and have a >= 20 pack-year smoking history should undergo annual screening with low-dose computed tomography (LDCT). Skin cancer screening: Advised regular sunscreen use. He denies worrisome, changing, or new skin lesions. Showed him pictures of melanomas for reference:     Subjective  48 y.o. male presents today for a complete physical exam.  HPI  History of Present Illness The patient, with a history of prediabetes, high cholesterol, and beta thalassemia trait, presents with a primary complaint of persistent left hip pain. The pain, which began at the start of the year, is described as severe enough to affect mobility, necessitating the use of ibuprofen, Tylenol, and a muscle relaxer for relief. The patient reports a similar episode of right hip pain last summer, which resolved after about a month and a half.  The pain is localized to the left hip and does not radiate into the groin. It is exacerbated by weight-bearing, flexion, extension, and abduction of the hip. The patient describes difficulty walking and has been compensating by putting pressure on the right leg, which has led to discomfort in other areas due to the altered gait.  The patient sought care at an urgent care center approximately ten days ago, where  inflammation was suspected, and steroids were prescribed. However, the patient reports no significant improvement with this treatment.  The patient has made significant lifestyle changes, including adopting a vegan diet and ceasing alcohol consumption. They report no recent headaches and deny current tobacco use. The patient has a history of eustachian tube dysfunction but is not currently treating it. They also report a history of occasional low libido.  The patient has a history of high cholesterol and prediabetes, which are being monitored. They also have beta thalassemia trait, which is not currently causing symptoms. The patient has a history of smoking, primarily when drinking alcohol, but has since ceased both activities. They deny current use of birth control or risk for sexually transmitted infections.  The patient has been experiencing financial concerns, which have influenced their approach to healthcare appointments and treatments. Despite these concerns, the patient expresses a strong desire to maintain good health and is willing to invest in necessary treatments and interventions.  A comprehensive ROS was negative for any concerning symptoms.  He only reports hip pain issues as above.   Past Medical History - Allergies - Hip pain - High blood pressure - Eustachian tube dysfunction  Problem list overviews that were updated at today's visit: Problem  Prediabetes   Lab Results  Component Value Date   HGBA1C 5.3 01/26/2023        Hyperlipidemia   Tg over 500  Medications: not yet discussed Lab Results  Component Value Date   HDL 23 (L) 01/27/2023   CHOLHDL 6.5 01/27/2023   Lab Results  Component Value Date   LDLCALC UNABLE TO CALCULATE IF TRIGLYCERIDE OVER 400 mg/dL 16/06/9603   LDLDIRECT 53 01/27/2023   Lab Results  Component Value Date   TRIG 568 (H) 01/27/2023   Lab Results  Component Value Date   CHOL 149 01/27/2023   The 10-year ASCVD risk score (Arnett  DK, et al., 2019) is: 8.1%   Values used to calculate the score:     Age: 55 years     Sex: Male     Is Non-Hispanic African American: Yes     Diabetic: No     Tobacco smoker: No     Systolic Blood Pressure: 130 mmHg     Is BP treated: Yes     HDL Cholesterol: 23 mg/dL     Total Cholesterol: 149 mg/dL Lab Results  Component Value Date   ALT 42 03/05/2023   AST 32 03/05/2023   ALKPHOS 64 03/05/2023   TSH 5.830 (H) 03/05/2023   HGBA1C 5.3 01/26/2023   Body mass index is 33.78 kg/m.  Lipoprotein(a), Apolipoprotein B (ApoB), and High-sensitivity C-reactive protein (hs-CRP) No results found for: "HSCRP", "LIPOA" Improving Your Cholesterol: Diet: Focus on a Mediterranean-style diet, limit saturated fats and sugars, and increase omega-3 fatty acids (fish, flaxseeds,nuts,extra virgin olive oil, avocados). Exercise: Engage in regular physical activity (aerobic exercises are particularly beneficial for HDL). Weight Management: Maintain a healthy weight. Smoking Cessation: Quitting smoking improves cholesterol levels.    Generalized Headaches (Resolved)  Alcohol Intake Above Recommended Sensible Limits (Resolved)   He has completely stopped drinking. Would resolve this but he would like to have something in case he gets cravings.     I attest that I have reviewed and confirmed the patients current medications to meet the medication reconciliation requirement  Current Outpatient Medications on File Prior to Visit  Medication Sig   cyclobenzaprine (FLEXERIL) 10 MG tablet Take 0.5-1 tablets (5-10 mg total) by mouth 3 (three) times daily as needed.   ibuprofen (ADVIL) 800 MG tablet Take 800 mg by mouth every 6 (six) hours as needed.   predniSONE (DELTASONE) 10 MG tablet Take 3 tablets (30 mg total) by mouth daily with breakfast.   amLODipine (NORVASC) 5 MG tablet Take 1 tablet (5 mg total) by mouth daily. (Patient not taking: Reported on 10/14/2023)   metFORMIN (GLUCOPHAGE) 500 MG tablet  Take 1 tablet (500 mg total) by mouth 2 (two) times daily with a meal. (Patient not taking: Reported on 10/14/2023)   methylPREDNISolone (MEDROL DOSEPAK) 4 MG TBPK tablet 6 day taper; take as directed on package instructions (Patient not taking: Reported on 10/14/2023)   No current facility-administered medications on file prior to visit.   Medications Discontinued During This Encounter  Medication Reason   fluticasone (FLONASE) 50 MCG/ACT nasal spray    acamprosate (CAMPRAL) 333 MG tablet    Outpatient Medications Prior to Visit  Medication Sig   cyclobenzaprine (  FLEXERIL) 10 MG tablet Take 0.5-1 tablets (5-10 mg total) by mouth 3 (three) times daily as needed.   ibuprofen (ADVIL) 800 MG tablet Take 800 mg by mouth every 6 (six) hours as needed.   predniSONE (DELTASONE) 10 MG tablet Take 3 tablets (30 mg total) by mouth daily with breakfast.   amLODipine (NORVASC) 5 MG tablet Take 1 tablet (5 mg total) by mouth daily. (Patient not taking: Reported on 10/14/2023)   metFORMIN (GLUCOPHAGE) 500 MG tablet Take 1 tablet (500 mg total) by mouth 2 (two) times daily with a meal. (Patient not taking: Reported on 10/14/2023)   methylPREDNISolone (MEDROL DOSEPAK) 4 MG TBPK tablet 6 day taper; take as directed on package instructions (Patient not taking: Reported on 10/14/2023)   [DISCONTINUED] acamprosate (CAMPRAL) 333 MG tablet Take 2 tablets (666 mg total) by mouth 3 (three) times daily with meals.   [DISCONTINUED] fluticasone (FLONASE) 50 MCG/ACT nasal spray Place 2 sprays into both nostrils daily.   No facility-administered medications prior to visit.  The following were reviewed and/or entered/updated into our electronic MEDICAL RECORD NUMBERPast Medical History:  Diagnosis Date   AKI (acute kidney injury) (HCC) 01/26/2023   Anxiety    Cigarette smoker 01/26/2023   Dry mouth 01/21/2023   Severe dry throat upon aweakening Seems due to chronic severe nasal obstructions while sleeping    Elevated  troponin 01/26/2023   Erectile dysfunction 01/21/2023   Has health insurance with inadequate coverage of health expenses 10/03/2022   He is on a high deductible health savings account so that he would have to pay the entire cost of lab work to do additional labs for his problem of elevated thyroid microcytosis   History of rhabdomyolysis 01/26/2023   Lab Results  Component  Value  Date     CKTOTAL  309 (H)  03/05/2023     CKTOTAL  2,703 (H)  01/29/2023     CKTOTAL  3,743 (H)  01/28/2023     CKTOTAL  3,653 (H)  01/28/2023     CKTOTAL  4,314 (H)  01/27/2023  Staying hydrated and avoiding muscle injury is important      Hypertension 03/14/2023   Left-sided chest pain 01/27/2023   Liver injury 03/05/2023   Mixed hyperlipidemia 10/03/2022   Tg over 500    Non-traumatic rhabdomyolysis 01/27/2023   Precordial chest pain 01/26/2023   Precordial pain 01/26/2023   Synovitis of hip 06/20/2021   Tachycardia 01/26/2023  History reviewed. No pertinent surgical history. Social History - Denies smoking - Denies drugs - Previously patient used alcohol, but now stopped Social History   Socioeconomic History   Marital status: Single    Spouse name: Not on file   Number of children: Not on file   Years of education: Not on file   Highest education level: Not on file  Occupational History   Not on file  Tobacco Use   Smoking status: Former    Current packs/day: 0.00    Types: Cigarettes    Quit date: 04/03/2014    Years since quitting: 9.5   Smokeless tobacco: Never  Vaping Use   Vaping status: Never Used  Substance and Sexual Activity   Alcohol use: Not Currently   Drug use: Never   Sexual activity: Yes    Birth control/protection: None  Other Topics Concern   Not on file  Social History Narrative   Not on file   Social Drivers of Health   Financial Resource Strain: Not on file  Food  Insecurity: No Food Insecurity (01/26/2023)   Hunger Vital Sign    Worried About Running Out of Food  in the Last Year: Never true    Ran Out of Food in the Last Year: Never true  Transportation Needs: No Transportation Needs (01/26/2023)   PRAPARE - Administrator, Civil Service (Medical): No    Lack of Transportation (Non-Medical): No  Physical Activity: Not on file  Stress: Not on file  Social Connections: Not on file  Intimate Partner Violence: Not At Risk (01/26/2023)   Humiliation, Afraid, Rape, and Kick questionnaire    Fear of Current or Ex-Partner: No    Emotionally Abused: No    Physically Abused: No    Sexually Abused: No       No data to display         Family Status  Relation Name Status   Mother  Deceased   Father  Alive  No partnership data on file   Family History  Problem Relation Age of Onset   Heart failure Mother    Diabetes Mother    Tuberculosis Mother    Dementia Father    Allergies  Allergen Reactions   Tape Swelling    States allergic to apoxy   Patient Care Team: Lula Olszewski, MD as PCP - General (Internal Medicine)    Objective  BP 130/88   Pulse 76   Temp 98.1 F (36.7 C) (Temporal)   Ht 5\' 11"  (1.803 m)   Wt 242 lb 3.2 oz (109.9 kg)   SpO2 100%   BMI 33.78 kg/m Physical ExamBody mass index is 33.78 kg/m. BP Readings from Last 3 Encounters:  10/14/23 130/88  10/07/23 126/88  06/18/23 113/71   Wt Readings from Last 3 Encounters:  10/14/23 242 lb 3.2 oz (109.9 kg)  06/18/23 237 lb (107.5 kg)  03/14/23 240 lb 10.1 oz (109.1 kg)   GEN: No acute distress, resting comfortably. HEENT: Scarring on eardrums, sinus congestion.  oropharynx clear, no thyromegaly noted, no palpable lymphadenopathy or thyroid nodules. Scarring on eardrums, sinus congestion. NECK: No lumps or bumps. MUSCULOSKELETAL: Pain with half body weight on left hip, increased pain with full body weight, pain on extending and flexing the hip, pain on abduction of the hip, pain localized to the groin area. No edema, cyanosis, or clubbing noted. SKIN: No  signs of skin cancer under fingernails or on palms. CARDIOVASCULAR: S1 and S2 heart sounds with regular rate and rhythm, no murmurs appreciated. PULMONARY: Normal work of breathing, clear to auscultation bilaterally, no crackles, wheezes, or rhonchi. ABDOMEN: Soft, nontender, nondistended. Truncal adiposity  SKIN: Warm, dry, no lesions of concern observed. NEUROLOGICAL: Cranial nerves II-XII grossly intact, strength 5/5 in upper and lower extremities, reflexes symmetric and intact bilaterally. PSYCH: Normal affect and thought content, pleasant and cooperative.  Results Reviewed: Last depression screening scores    10/14/2023    9:24 AM 03/05/2023    8:24 AM 10/03/2022    1:18 PM  PHQ 2/9 Scores  PHQ - 2 Score 0 0 0   Last fall risk screening    10/14/2023    9:24 AM  Fall Risk   Falls in the past year? 0  Number falls in past yr: 0  Injury with Fall? 0  Risk for fall due to : No Fall Risks  Follow up Falls evaluation completed       Lula Olszewski, MD  [facility] [phone] (phone) [phone] (  fax) Nature conservation officer at [facility]

## 2023-10-14 NOTE — Assessment & Plan Note (Signed)
Prediabetes Focus on weight management and dietary changes to prevent progression to diabetes. Emphasized the importance of weight loss and dietary modifications to manage blood glucose levels. Plan: recheck blood glucose levels and advise on weight loss and dietary modifications.

## 2023-10-14 NOTE — Patient Instructions (Signed)
VISIT SUMMARY:  During today's visit, we discussed your persistent left hip pain, which has been affecting your mobility and daily activities. We also reviewed your history of high cholesterol, prediabetes, and beta thalassemia trait. Additionally, we addressed your concerns about low libido and general health maintenance.  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 This condition can cause significant pain and mobility issues. We discussed the need for an MRI to confirm the diagnosis and the possibility of surgical intervention. You will be referred to an orthopedist for further evaluation. In the meantime, we have prescribed Celebrex 200 mg for pain management and discussed the potential for a steroid injection if necessary. Physical therapy may also be considered as a prerequisite for surgery.  -HYPERLIPIDEMIA: Hyperlipidemia means you have high levels of fats (lipids) in your blood, which increases your risk of cardiovascular disease. Your triglycerides are elevated, and your HDL is very low. We discussed the potential benefit of a CT scan to assess for coronary artery disease and screen for lung cancer. We will recheck your cholesterol levels and advise you to make dietary changes, such as lowering carbohydrate intake and increasing healthy fats like extra virgin olive oil.  -PREDIABETES: Prediabetes is a condition where your blood sugar levels are higher than normal but not high enough to be classified as diabetes. It increases your risk of developing type 2 diabetes. We emphasized the importance of weight management and dietary changes to prevent progression to diabetes. We will recheck your blood glucose levels and advise you on weight loss and dietary modifications.  -LOW LIBIDO: Low libido refers to a decreased interest in sexual activity. We discussed potential causes and agreed to test your  testosterone levels to determine if there is a hormonal imbalance contributing to this issue.  -GENERAL HEALTH MAINTENANCE: You are generally healthy and have made positive lifestyle changes, such as adopting a vegan diet and quitting smoking and alcohol. We discussed the importance of vitamin D and B12 supplementation due to your vegan diet. We also recommended colon cancer screening with Cologuard and prostate cancer screening with a PSA test, which is a blood test for early detection of prostate cancer. We will order annual blood work, including cholesterol, thyroid, and diabetes screening.  INSTRUCTIONS:  Please follow up with the orthopedist as discussed. Schedule a CT scan at [facility]. We will see you in one year for your annual physical.  Building Your Long-Term Health Plan  During today's preventive visit, we covered a variety of important health checks to help you stay on top of your well-being.  We also discussed strategies to maintain your health and identified some areas that might benefit from further exploration.   Preventive care visits like today's are designed to be proactive, but sometimes additional attention may be needed.  Rest assured, we're here for you.  If these areas require further evaluation or management, we'd be happy to schedule a separate, focused appointment to address them in detail.  Addressing Next Steps  [x]   Follow-up Visit: To ensure we address any unresolved issues and continue monitoring your overall health, we recommend scheduling a follow-up appointment in 1 year for your next preventive care visit. If you experience any new problems, need to discuss any medical concerns, or your condition worsens before then, please don't hesitate to call our office to schedule an appointment or seek emergency care as needed.  [  x]  Preventive Measures: Maintaining healthy habits plays a crucial role in overall wellness. We recommend considering these tips: [x]   Regular  appointments with dental and vision professionals [x]   Nightly nasal saline mist to keep sinuses clear [x]   Consistent toothbrushing to maintain oral health [x]   Using an app like SnoreLab to track sleep quality [x]   Routine checks of blood pressure and heart rate [x]   Medical Information: In some instances, we may require additional medical information from other providers to create a comprehensive picture of your health. If applicable, we can provide a medical information release form at the front desk for you to sign, allowing [place] to gather these records. [x]   Lab Tests: If any lab tests were ordered today, scheduling them within a week of your visit helps ensure the best possible insurance coverage.  Planning Follow Up to Work on a Problem? Make the Most of Our Focused (20 minute) Appointments  [x]   Clearly state your top concerns at the beginning of the visit to focus our discussion [x]   If you anticipate you will need more time, please inform the front desk during scheduling - we can book multiple appointments in the same week. [x]   If you have transportation problems- use our convenient video appointments or ask about transportation support. [x]   We can get down to business faster if you use MyChart to update information before the visit and submit non-urgent questions before your visit. Thank you for taking the time to provide details through MyChart.  Let our nurse know and she can import this information into your encounter documents.  Arrival and Wait Times  [x]   Arriving on time ensures that everyone receives prompt attention. [x]   Early morning (8a) and afternoon (1p) appointments tend to have shortest wait times. [x]   Unfortunately, we cannot delay appointments for late arrivals or hold slots during phone calls.  Bring to Your Next Appointment:  [x]   Medications: Please bring all your medication bottles to your next appointment to ensure we have an accurate record of your  prescriptions. [x]   Health Diaries: If you're monitoring any health conditions at home, keeping a diary of your readings can be very helpful for discussions at your next appointment.  Reviewing Your Records  [x]   Review your attached preventive care information at the end of these patient instructions. [x]   Review this early draft of your clinical encounter notes below and the final encounter summary tomorrow on MyChart after its been completed.      Getting Answers and Following Up  [x]   Simple Questions & Concerns: For quick questions or basic follow-up after your visit, reach [place] at [phone] or MyChart messaging. [x]   Complex Concerns: If your concern is more complex, scheduling an appointment might be best. Discuss this with the staff to find the most suitable option. [x]   Lab & Imaging Results: We'll contact you directly if results are abnormal or you don't use MyChart. Most normal results will be on MyChart within 2-3 business days, with a review message from Dr. Jon Billings. Haven't heard back in 2 weeks? Need results sooner? Contact us at [phone]. [x]   Referrals: Our referral coordinator will manage specialist referrals. The specialist's office should contact you within 2 weeks to schedule an appointment. Call us if you haven't heard from them after 2 weeks.  Staying Connected  [x]   MyChart: Activate your MyChart for the fastest way to access results and message [place]. See the last page of this paperwork for instructions  on how to activate.  Billing  [x]   X-ray & Lab Orders: These are billed by separate companies. Contact the invoicing company directly for questions or concerns. [x]   Visit Charges: Discuss any billing inquiries with our administrative services team.  Your Satisfaction Matters  [x]   Share Your Experience: We strive for your satisfaction! If you have any complaints, or preferably compliments, please let Dr. Jon Billings know directly or contact our Practice  Administrators, Edwena Felty or [employer], by asking at the front desk.                 Next Steps  [x]   Schedule Follow-Up:  We recommend a follow-up appointment in 1 year for your next wellness visit.  If you develop any new problems, want to address any medical issues, or your condition worsens before then, please call us for an appointment or seek emergency care. [x]   Preventive Care:  Make sure to keep regular appointments with dental and vision professionals, use nightly nasal saline mist sprays to keep your sinuses clear and toothbrushing to protect your teeth. Use SnoreLab App or other app to track your sleep quality. Check blood pressure and heart rate routinely. [x]   Medical Information Release:  For any relevant medical information we don't have, please sign a release form at the front desk so we can obtain it for your records. [x]   Lab Tests:  Schedule any lab tests from today for within a week to ensure best insurance coverage.    Making the Most of Our Focused (20 minute) Appointments:  [x]   Clearly state your top concerns at the beginning of the visit to focus our discussion [x]   If you anticipate you will need more time, please inform the front desk during scheduling - we can book multiple appointments in the same week. [x]   If you have transportation problems- use our convenient video appointments or ask about transportation support. [x]   We can get down to business faster if you use MyChart to update information before the visit and submit non-urgent questions before your visit. Thank you for taking the time to provide details through MyChart.  Let our nurse know and she can import this information into your encounter documents.  Arrival and Wait Times: [x]   Arriving on time ensures that everyone receives prompt attention. [x]   Early morning (8a) and afternoon (1p) appointments tend to have shortest wait times. [x]   Unfortunately, we cannot delay appointments  for late arrivals or hold slots during phone calls.  Bring to Your Next Appointment  [x]   Medications: Please bring all your medication bottles to your next appointment to ensure we have an accurate record of your prescriptions. [x]   Health Diaries: If you're monitoring any health conditions at home, keeping a diary of your readings can be very helpful for discussions at your next appointment.  Reviewing Your Records  [x]   Review your attached preventive care information at the end of these patient instructions. [x]   Review this early draft of your clinical encounter notes below and the final encounter summary tomorrow on MyChart after its been completed.   Screen for colon cancer -     Cologuard  Encounter for annual health examination -     PSA -     Lipid panel -     Comprehensive metabolic panel -     CBC with Differential/Platelet -     TSH Rfx on Abnormal to Free T4 -     Hemoglobin A1c -  Cologuard  Encounter for annual general medical examination with abnormal findings in adult  Pain of left hip -     Ambulatory referral to Orthopedic Surgery -     predniSONE; Take 2 pills for 3 days, 1 pill for 4 days  Dispense: 10 tablet; Refill: 0 -     Celecoxib; Take 1 capsule (200 mg total) by mouth 2 (two) times daily.  Dispense: 60 capsule; Refill: 2  Vegan diet  Subclinical hypothyroidism  Sleep apnea, unspecified type  RBC microcytosis  Prediabetes Assessment & Plan: Prediabetes Focus on weight management and dietary changes to prevent progression to diabetes. Emphasized the importance of weight loss and dietary modifications to manage blood glucose levels. Plan: recheck blood glucose levels and advise on weight loss and dietary modifications.   Obesity (BMI 30-39.9)  Low libido -     Testosterone  Low HDL (under 40)  At risk for heart disease -     CT CARDIAC SCORING (SELF PAY ONLY); Future  Mixed hyperlipidemia Assessment & Plan: Hyperlipidemia Elevated  triglycerides over 500 and very low HDL increase cardiovascular risk. Discussed the potential benefit of a CT scan to assess coronary artery disease and screen for lung cancer, noting cost and lack of insurance coverage. Plan: order CT scan of the heart, recheck cholesterol levels, and advise on dietary modifications to lower carbs and increase healthy fats like extra virgin olive oil.   History of smoking  Primary hypertension     Getting Answers and Following Up  [x]   Simple Questions & Concerns: For quick questions or basic follow-up after your visit, reach [place] at [phone] or MyChart messaging. [x]   Complex Concerns: If your concern is more complex, scheduling an appointment might be best. Discuss this with the staff to find the most suitable option. [x]   Lab & Imaging Results: We'll contact you directly if results are abnormal or you don't use MyChart. Most normal results will be on MyChart within 2-3 business days, with a review message from Dr. Jon Billings. Haven't heard back in 2 weeks? Need results sooner? Contact us at [phone]. [x]   Referrals: Our referral coordinator will manage specialist referrals. The specialist's office should contact you within 2 weeks to schedule an appointment. Call us if you haven't heard from them after 2 weeks.  Staying Connected  [x]   MyChart: Activate your MyChart for the fastest way to access results and message [place]. See the last page of this paperwork for instructions on how to activate.  Billing  [x]   X-ray & Lab Orders: These are billed by separate companies. Contact the invoicing company directly for questions or concerns. [x]   Visit Charges: Discuss any billing inquiries with our administrative services team.  Your Satisfaction Matters  [x]   Share Your Experience: We strive for your satisfaction! If you have any complaints, or preferably compliments, please let Dr. Jon Billings know directly or contact our Practice Administrators, Edwena Felty or [employer], by asking at the front desk.

## 2023-10-15 LAB — TSH RFX ON ABNORMAL TO FREE T4: TSH: 5.69 u[IU]/mL — ABNORMAL HIGH (ref 0.450–4.500)

## 2023-10-15 LAB — T4F: T4,Free (Direct): 1.14 ng/dL (ref 0.82–1.77)

## 2023-10-16 ENCOUNTER — Telehealth: Payer: Self-pay | Admitting: Internal Medicine

## 2023-10-16 DIAGNOSIS — R52 Pain, unspecified: Secondary | ICD-10-CM

## 2023-10-16 NOTE — Telephone Encounter (Signed)
RX REQUEST:  IBUPROFEN 800 MG PHARMACY: [facility] [place] [facility] PH#:  [phone]

## 2023-10-16 NOTE — Telephone Encounter (Unsigned)
Copied from CRM [id number]. Topic: Referral - Status >> Oct 16, 2023  9:49 AM Russell Dawson B wrote: Reason for CRM: pt called to follow up on referral to Ortho DR. States he still has not heard from anyone  in regards to the referral. Please call pt back at [phone]

## 2023-10-17 ENCOUNTER — Encounter: Payer: Self-pay | Admitting: Physician Assistant

## 2023-10-17 ENCOUNTER — Encounter: Payer: Self-pay | Admitting: Internal Medicine

## 2023-10-17 ENCOUNTER — Ambulatory Visit: Payer: Managed Care, Other (non HMO) | Admitting: Physician Assistant

## 2023-10-17 DIAGNOSIS — M25552 Pain in left hip: Secondary | ICD-10-CM | POA: Diagnosis not present

## 2023-10-17 MED ORDER — MELOXICAM 15 MG PO TABS: 15.0000 mg | ORAL_TABLET | Freq: Every day | ORAL | 0 refills | Status: DC

## 2023-10-17 MED ORDER — IBUPROFEN 800 MG PO TABS: 800.0000 mg | ORAL_TABLET | Freq: Three times a day (TID) | ORAL | 3 refills | Status: DC | PRN

## 2023-10-17 NOTE — Addendum Note (Signed)
Addended by: Lula Olszewski on: 10/17/2023 09:21 AM   Modules accepted: Orders

## 2023-10-17 NOTE — Progress Notes (Signed)
Office Visit Note   Patient: Russell Dawson           Date of Birth: [birth year]           MRN: [id number] Visit Date: 10/17/2023              Requested by: Lula Olszewski, MD [address] [place],  [place] [address] PCP: Lula Olszewski, MD   Assessment & Plan: Visit Diagnoses:  1. Pain in left hip     Plan: Patient is a pleasant 48 year old gentleman who comes in with a 4-week history of left groin pain.  Denies any injury.  He had a similar problem with his right hip a couple years ago and was given a steroid taper which did not help much but eventually the pain just go away.  He was seen and evaluated by his primary care to rule out any other causes for the hip pain.  Denies any radicular findings.  X-rays do not show much degenerative change but certainly he has a lot of pain with external rotation of his hip with some impingement findings.  Would recommend an MRI arthrogram of the left hip and follow-up with Dr. Steward Drone.  In the meantime he will finish his second steroid pack that was given to him by his primary care provider.  I will call him in some meloxicam to take instead of ibuprofen but he must not take this till he is done with the steroids he was on Celebrex but had to stop this because it was causing him reflux  Follow-Up Instructions: No follow-ups on file.   Orders:  No orders of the defined types were placed in this encounter.  No orders of the defined types were placed in this encounter.     Procedures: No procedures performed   Clinical Data: No additional findings.   Subjective: No chief complaint on file.   HPI pleasant 48 year old gentleman with 4-week history of left hip pain no particular injury.  Pain is noticed when in his groin and has difficulty bearing weight through the groin and hip joint.  Was evaluated by his primary care.  Had similar problems with the right hip a couple years ago but resolved.  This is not gotten better he  has been through 1 steroid Dosepak now starting on the second 1 no other joint pain  Review of Systems  All other systems reviewed and are negative.    Objective: Vital Signs: There were no vitals taken for this visit.  Physical Exam Constitutional:      Appearance: Normal appearance.  Pulmonary:     Effort: Pulmonary effort is normal.  Skin:    General: Skin is warm and dry.  Neurological:     General: No focal deficit present.     Mental Status: He is alert and oriented to person, place, and time.  Psychiatric:        Mood and Affect: Mood normal.        Behavior: Behavior normal.     Ortho Exam Examination of his left hip he is neurovascular intact no tenderness over the trochanteric bursa he does have pain with external rotation.  Not as much with internal rotation strength is intact pain is focused in the groin Specialty Comments:  No specialty comments available.  Imaging: No results found.   PMFS History: Patient Active Problem List   Diagnosis Date Noted   Pain in left hip 10/17/2023   Beta thalassemia trait 03/16/2023  Healthcare maintenance 03/16/2023   Hypertension 03/14/2023   Prediabetes 01/26/2023   Low libido 01/21/2023   Erectile dysfunction 01/21/2023   Subclinical hypothyroidism 10/03/2022   Hyperlipidemia 10/03/2022   History of smoking 10/03/2022   Obesity (BMI 30-39.9) 10/03/2022   RBC microcytosis 10/03/2022   Eustachian tube dysfunction, bilateral 10/03/2022   Has health insurance with inadequate coverage of health expenses 10/03/2022   Sleep apnea 10/03/2022   Past Medical History:  Diagnosis Date   AKI (acute kidney injury) (HCC) 01/26/2023   Anxiety    Cigarette smoker 01/26/2023   Dry mouth 01/21/2023   Severe dry throat upon aweakening Seems due to chronic severe nasal obstructions while sleeping    Elevated troponin 01/26/2023   Erectile dysfunction 01/21/2023   Has health insurance with inadequate coverage of health  expenses 10/03/2022   He is on a high deductible health savings account so that he would have to pay the entire cost of lab work to do additional labs for his problem of elevated thyroid microcytosis   History of rhabdomyolysis 01/26/2023   Lab Results  Component  Value  Date     CKTOTAL  309 (H)  03/05/2023     CKTOTAL  2,703 (H)  01/29/2023     CKTOTAL  3,743 (H)  01/28/2023     CKTOTAL  3,653 (H)  01/28/2023     CKTOTAL  4,314 (H)  01/27/2023  Staying hydrated and avoiding muscle injury is important      Hypertension 03/14/2023   Left-sided chest pain 01/27/2023   Liver injury 03/05/2023   Mixed hyperlipidemia 10/03/2022   Tg over 500    Non-traumatic rhabdomyolysis 01/27/2023   Precordial chest pain 01/26/2023   Precordial pain 01/26/2023   Synovitis of hip 06/20/2021   Tachycardia 01/26/2023    Family History  Problem Relation Age of Onset   Heart failure Mother    Diabetes Mother    Tuberculosis Mother    Dementia Father     History reviewed. No pertinent surgical history. Social History   Occupational History   Not on file  Tobacco Use   Smoking status: Former    Current packs/day: 0.00    Types: Cigarettes    Quit date: 04/03/2014    Years since quitting: 9.5   Smokeless tobacco: Never  Vaping Use   Vaping status: Never Used  Substance and Sexual Activity   Alcohol use: Not Currently   Drug use: Never   Sexual activity: Yes    Birth control/protection: None

## 2023-10-17 NOTE — Progress Notes (Signed)
Reviewed labs from 10/14/2023. Notable findings: Elevated triglycerides (246), very low HDL (20.7), elevated TSH (0.98), low testosterone (233.87). A1c stable in prediabetes range at 6.3%. PSA normal. Left hip pain concerning for labral tear - orthopedics referral placed. Starting Celebrex for pain management. Cardiac CT ordered given cardiovascular risk factors. Detailed treatment plan in Patient Message.

## 2023-10-21 NOTE — Telephone Encounter (Signed)
Patient's review of lab results/notes confirmed.

## 2023-11-05 ENCOUNTER — Encounter: Payer: Self-pay | Admitting: Physician Assistant

## 2023-11-08 ENCOUNTER — Other Ambulatory Visit: Payer: Self-pay | Admitting: Physician Assistant

## 2023-11-08 ENCOUNTER — Inpatient Hospital Stay: Admission: RE | Admit: 2023-11-08 | Payer: Managed Care, Other (non HMO) | Source: Ambulatory Visit

## 2023-11-08 ENCOUNTER — Ambulatory Visit
Admission: RE | Admit: 2023-11-08 | Discharge: 2023-11-08 | Disposition: A | Discharge status: Home / Self Care | Payer: Managed Care, Other (non HMO) | Source: Ambulatory Visit | Attending: Physician Assistant | Admitting: Physician Assistant

## 2023-11-08 ENCOUNTER — Other Ambulatory Visit: Payer: Managed Care, Other (non HMO)

## 2023-11-08 DIAGNOSIS — M25552 Pain in left hip: Secondary | ICD-10-CM

## 2023-11-08 DIAGNOSIS — M629 Disorder of muscle, unspecified: Secondary | ICD-10-CM

## 2023-11-08 MED ORDER — IOPAMIDOL (ISOVUE-M 200) INJECTION 41%
10.0000 mL | Freq: Once | INTRAMUSCULAR | Status: AC
  Administered 2023-11-08: 10 mL via INTRA_ARTICULAR

## 2023-11-11 ENCOUNTER — Other Ambulatory Visit: Payer: Self-pay | Admitting: Physician Assistant

## 2023-11-11 MED ORDER — CYCLOBENZAPRINE HCL 10 MG PO TABS: 5.0000 mg | ORAL_TABLET | Freq: Three times a day (TID) | ORAL | 0 refills | Status: DC | PRN

## 2023-11-15 ENCOUNTER — Ambulatory Visit (INDEPENDENT_AMBULATORY_CARE_PROVIDER_SITE_OTHER): Payer: Managed Care, Other (non HMO) | Admitting: Orthopaedic Surgery

## 2023-11-15 DIAGNOSIS — M25552 Pain in left hip: Secondary | ICD-10-CM

## 2023-11-15 MED ORDER — LIDOCAINE HCL 1 % IJ SOLN
4.0000 mL | INTRAMUSCULAR | Status: AC | PRN
  Administered 2023-11-15: 4 mL

## 2023-11-15 MED ORDER — TRIAMCINOLONE ACETONIDE 40 MG/ML IJ SUSP
80.0000 mg | INTRAMUSCULAR | Status: AC | PRN
Start: 2023-11-15 — End: 2023-11-15
  Administered 2023-11-15: 80 mg via INTRA_ARTICULAR

## 2023-11-15 NOTE — Progress Notes (Signed)
 Russell Complaint: Left hip pain     History of Present Illness:    Russell Dawson is a 48 y.o. male presents today with ongoing left hip pain for the last 2 months.  He has been to urgent care for this.  He is experiencing the pain in a C-shaped distribution of the left hip.  He has been taking anti-inflammatories as well as a steroid which did help him.  He does have a history of similar symptoms on the right side.  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    PMH/PSH/Family History/Social History/Meds/Allergies:    Past Medical History:  Diagnosis Date  . AKI (acute kidney injury) (HCC) 01/26/2023  . Anxiety   . Cigarette smoker 01/26/2023  . Dry mouth 01/21/2023   Severe dry throat upon aweakening Seems due to chronic severe nasal obstructions while sleeping   . Elevated troponin 01/26/2023  . Erectile dysfunction 01/21/2023  . Has health insurance with inadequate coverage of health expenses 10/03/2022   He is on a high deductible health savings account so that he would have to pay the entire cost of lab work to do additional labs for his problem of elevated thyroid microcytosis  . History of rhabdomyolysis 01/26/2023   Lab Results  Component  Value  Date     CKTOTAL  309 (H)  03/05/2023     CKTOTAL  2,703 (H)  01/29/2023     CKTOTAL  3,743 (H)  01/28/2023     CKTOTAL  3,653 (H)  01/28/2023     CKTOTAL  4,314 (H)  01/27/2023  Staying hydrated and avoiding muscle injury is important     . Hypertension 03/14/2023  . Left-sided chest pain 01/27/2023  . Liver injury 03/05/2023  . Mixed hyperlipidemia 10/03/2022   Tg over 500   . Non-traumatic rhabdomyolysis 01/27/2023  . Precordial chest pain 01/26/2023  . Precordial pain 01/26/2023  . Synovitis of hip 06/20/2021  . Tachycardia 01/26/2023   No past surgical history on file. Social History   Socioeconomic History  . Marital status: Single    Spouse name: Not on file  . Number of children:  Not on file  . Years of education: Not on file  . Highest education level: Not on file  Occupational History  . Not on file  Tobacco Use  . Smoking status: Former    Current packs/day: 0.00    Types: Cigarettes    Quit date: 04/03/2014    Years since quitting: 9.6  . Smokeless tobacco: Never  Vaping Use  . Vaping status: Never Used  Substance and Sexual Activity  . Alcohol use: Not Currently  . Drug use: Never  . Sexual activity: Yes    Birth control/protection: None  Other Topics Concern  . Not on file  Social History Narrative  . Not on file   Social Drivers of Health   Financial Resource Strain: Not on file  Food Insecurity: No Food Insecurity (01/26/2023)   Hunger Vital Sign   . Worried About Programme researcher, broadcasting/film/video in the Last Year: Never true   . Ran Out of Food in the Last Year: Never true  Transportation Needs: No Transportation Needs (01/26/2023)   PRAPARE - Transportation   . Lack of Transportation (Medical): No   . Lack of Transportation (Non-Medical): No  Physical Activity: Not on file  Stress: Not on file  Social Connections: Not on file   Family History  Problem Relation  Age of Onset  . Heart failure Mother   . Diabetes Mother   . Tuberculosis Mother   . Dementia Father    Allergies  Allergen Reactions  . Tape Swelling    States allergic to apoxy   Current Outpatient Medications  Medication Sig Dispense Refill  . amLODipine (NORVASC) 5 MG tablet Take 1 tablet (5 mg total) by mouth daily. (Patient not taking: Reported on 10/14/2023) 90 tablet 3  . celecoxib (CELEBREX) 200 MG capsule Take 1 capsule (200 mg total) by mouth 2 (two) times daily. 60 capsule 2  . cyclobenzaprine (FLEXERIL) 10 MG tablet Take 0.5-1 tablets (5-10 mg total) by mouth 3 (three) times daily as needed. 30 tablet 0  . ibuprofen (ADVIL) 800 MG tablet Take 800 mg by mouth every 6 (six) hours as needed.    Marland Kitchen ibuprofen (ADVIL) 800 MG tablet Take 1 tablet (800 mg total) by mouth every 8  (eight) hours as needed. 90 tablet 3  . meloxicam (MOBIC) 15 MG tablet TAKE 1 TABLET(15 MG) BY MOUTH DAILY 30 tablet 0  . metFORMIN (GLUCOPHAGE) 500 MG tablet Take 1 tablet (500 mg total) by mouth 2 (two) times daily with a meal. (Patient not taking: Reported on 10/14/2023) 180 tablet 3  . methylPREDNISolone (MEDROL DOSEPAK) 4 MG TBPK tablet 6 day taper; take as directed on package instructions (Patient not taking: Reported on 10/14/2023) 21 tablet 0  . predniSONE (DELTASONE) 10 MG tablet Take 3 tablets (30 mg total) by mouth daily with breakfast. 15 tablet 0  . predniSONE (DELTASONE) 20 MG tablet Take 2 pills for 3 days, 1 pill for 4 days 10 tablet 0   No current facility-administered medications for this visit.   No results found.  Review of Systems:   A ROS was performed including pertinent positives and negatives as documented in the HPI.  Physical Exam :   Constitutional: NAD and appears stated age Neurological: Alert and oriented Psych: Appropriate affect and cooperative There were no vitals taken for this visit.   Comprehensive Musculoskeletal Exam:    Inspection Right Left  Skin No atrophy or gross abnormalities appreciated No atrophy or gross abnormalities appreciated  Palpation    Tenderness None None  Crepitus None None  Range of Motion    Flexion (passive) 120 120  Extension 30 30  IR 30 30 with pain  ER 40 40  Strength    Flexion  5/5 5/5  Extension 5/5 5/5  Special Tests    FABER Negative Negative  FADIR Negative Negative  ER Lag/Capsular Insufficiency Negative Negative  Instability Negative Negative  Sacroiliac pain Negative  Negative   Instability    Generalized Laxity No No  Neurologic    sciatic, femoral, obturator nerves intact to light sensation  Vascular/Lymphatic    DP pulse 2+ 2+  Lumbar Exam    Patient has symmetric lumbar range of motion with negative pain referral to hip      Imaging:   Xray (3 views left hip): Normal  MRI (left  hip): Anterior superior labral tear   I personally reviewed and interpreted the radiographs.   Assessment and Plan:   49 y.o. male with left hip labral tear consistent with possible instability.  At today's visit I did recommend an initial ultrasound-guided injection of the left femoral acetabular joint as well as some initial physical therapy to allow for a home strengthening program.  I would like him to specifically work on his gluteal strength.  I will plan  to see him back in 6 weeks for reassessment  -Return to clinic 6 weeks for reassessment    Procedure Note  Patient: Russell Dawson             Date of Birth: May 23, 1976           MRN: [id number]             Visit Date: 11/15/2023  Procedures: Visit Diagnoses: No diagnosis found.  Large Joint Inj: L hip joint on 11/15/2023 11:29 AM Indications: pain Details: 22 G 3.5 in needle, ultrasound-guided anterolateral approach  Arthrogram: No  Medications: 4 mL lidocaine 1 %; 80 mg triamcinolone acetonide 40 MG/ML Outcome: tolerated well, no immediate complications Procedure, treatment alternatives, risks and benefits explained, specific risks discussed. Consent was given by the patient. Immediately prior to procedure a time out was called to verify the correct patient, procedure, equipment, support staff and site/side marked as required. Patient was prepped and draped in the usual sterile fashion.        I personally saw and evaluated the patient, and participated in the management and treatment plan.  Huel Cote, MD Attending Physician, Orthopedic Surgery  This document was dictated using Dragon voice recognition software. A reasonable attempt at proof reading has been made to minimize errors.

## 2023-11-18 ENCOUNTER — Other Ambulatory Visit: Payer: Managed Care, Other (non HMO)

## 2023-11-25 ENCOUNTER — Ambulatory Visit (HOSPITAL_BASED_OUTPATIENT_CLINIC_OR_DEPARTMENT_OTHER): Payer: Managed Care, Other (non HMO)

## 2023-12-11 ENCOUNTER — Telehealth (HOSPITAL_BASED_OUTPATIENT_CLINIC_OR_DEPARTMENT_OTHER): Payer: Self-pay | Admitting: Physical Therapy

## 2023-12-11 NOTE — Telephone Encounter (Signed)
 Called and LVM to remind patient of upcoming physical therapy evaluation appointment. Requested call back to confirm appointment attendance.

## 2023-12-13 ENCOUNTER — Ambulatory Visit (HOSPITAL_BASED_OUTPATIENT_CLINIC_OR_DEPARTMENT_OTHER): Payer: Managed Care, Other (non HMO) | Attending: Orthopaedic Surgery | Admitting: Physical Therapy

## 2024-03-25 ENCOUNTER — Emergency Department (HOSPITAL_BASED_OUTPATIENT_CLINIC_OR_DEPARTMENT_OTHER)
Admission: EM | Admit: 2024-03-25 | Discharge: 2024-03-25 | Disposition: A | Discharge status: Home / Self Care | Attending: Emergency Medicine | Admitting: Emergency Medicine

## 2024-03-25 ENCOUNTER — Ambulatory Visit

## 2024-03-25 ENCOUNTER — Other Ambulatory Visit: Payer: Self-pay

## 2024-03-25 ENCOUNTER — Encounter (HOSPITAL_BASED_OUTPATIENT_CLINIC_OR_DEPARTMENT_OTHER): Payer: Self-pay | Admitting: Emergency Medicine

## 2024-03-25 DIAGNOSIS — I10 Essential (primary) hypertension: Secondary | ICD-10-CM | POA: Diagnosis present

## 2024-03-25 DIAGNOSIS — R030 Elevated blood-pressure reading, without diagnosis of hypertension: Secondary | ICD-10-CM

## 2024-03-25 DIAGNOSIS — Z79899 Other long term (current) drug therapy: Secondary | ICD-10-CM | POA: Diagnosis not present

## 2024-03-25 NOTE — ED Triage Notes (Signed)
 Pt caox4, ambulatory NAD from dentist office stating he was told to come to ED because his BP was high. Pt prescribed HTN meds but has not taken them. Denies headaches, CP, visual abnormalities, or any complaints.

## 2024-03-25 NOTE — ED Notes (Signed)
 DC paperwork given and verbally understood.

## 2024-03-25 NOTE — ED Notes (Addendum)
 BP from Dentist - - 193/105 on the wrist

## 2024-03-25 NOTE — ED Provider Notes (Signed)
 [facility] AT [facility] Provider Note   CSN: [id number] Arrival date & time: 03/25/24  1123     Patient presents with: Hypertension   Russell Dawson is a 48 y.o. male with previous history of hypertension but is now taken off of medications presents to the emergency room today for evaluation of elevated blood pressure from the dentist.  Patient reports that he was sitting in the dentist chair for a cleaning and before the cleaning they took a blood pressure with a wrist cuff and reports that his blood pressure was elevated and he was told to come to the emergency department for his elevated blood pressure.  He reports he had a previous history of hypertension and was on amlodipine  however was taken off of his medication due to dietary changes and has not been on it.  He denies any stress but reports he was little anxious before getting his teeth cleaned.  He has no headache, vision changes, chest pain, shortness of breath, nausea, vomiting, trouble walking, trouble talking.  He reports that the code his blood pressure was elevated they told him to come to the emergency department.    Hypertension Pertinent negatives include no chest pain, no headaches and no shortness of breath.       Prior to Admission medications   Medication Sig Start Date End Date Taking? Authorizing Provider  amLODipine  (NORVASC ) 5 MG tablet Take 1 tablet (5 mg total) by mouth daily. Patient not taking: Reported on 10/14/2023 03/14/23   [name], MD  celecoxib  (CELEBREX ) 200 MG capsule Take 1 capsule (200 mg total) by mouth 2 (two) times daily. 10/14/23   [name], MD  ibuprofen  (ADVIL ) 800 MG tablet Take 800 mg by mouth every 6 (six) hours as needed. 09/28/23   [provider]  meloxicam  (MOBIC ) 15 MG tablet TAKE 1 TABLET(15 MG) BY MOUTH DAILY 11/11/23   Persons, Ronal Dragon, PA  methylPREDNISolone  (MEDROL  DOSEPAK) 4 MG TBPK tablet 6 day taper; take as  directed on package instructions Patient not taking: Reported on 10/14/2023 08/12/23   Vivienne Delon HERO, PA-C  predniSONE  (DELTASONE ) 10 MG tablet Take 3 tablets (30 mg total) by mouth daily with breakfast. 10/07/23   Christopher Savannah, PA-C    Allergies: Tape    Review of Systems  Eyes:  Negative for visual disturbance.  Respiratory:  Negative for shortness of breath.   Cardiovascular:  Negative for chest pain.  Gastrointestinal:  Negative for nausea and vomiting.  Musculoskeletal:  Negative for gait problem.  Neurological:  Negative for dizziness, speech difficulty, light-headedness and headaches.    Updated Vital Signs BP (!) 130/93 (BP Location: Right Arm)   Pulse 79   Temp 98.2 F (36.8 C) (Oral)   Resp 16   Ht 5' 11 (1.803 m)   Wt 107 kg   SpO2 99%   BMI 32.92 kg/m   Physical Exam Vitals and nursing note reviewed.  Constitutional:      General: He is not in acute distress.    Appearance: He is not ill-appearing or toxic-appearing.  HENT:     Mouth/Throat:     Mouth: Mucous membranes are moist.  Eyes:     General: No scleral icterus. Cardiovascular:     Rate and Rhythm: Normal rate.  Pulmonary:     Effort: Pulmonary effort is normal. No respiratory distress.  Musculoskeletal:     Cervical back: Normal range of motion.  Skin:    General: Skin  is warm and dry.  Neurological:     General: No focal deficit present.     Mental Status: He is alert.     Cranial Nerves: No cranial nerve deficit.     Sensory: No sensory deficit.     (all labs ordered are listed, but only abnormal results are displayed) Labs Reviewed - No data to display  EKG: None  Radiology: No results found.  Procedures   Medications Ordered in the ED - No data to display                               Medical Decision Making  48 y.o. male presents to the ER today for evaluation of elevated blood pressure. Differential diagnosis includes but is not limited to hypertension,  urgency/emergency, human error. Vital signs BP 130/93 in the room, HR 82 satting 100% on room air. Physical exam as noted above.   The patient was sent over here for elevated blood pressure.  Nursing documented that the blood pressure was 193/105 the patient reports that he does not really remember what the blood pressure was.  BP in the room is 130/93. The patient previously had a diagnosis of hypertension but changed his diet and was taken off the medications.  He has not had any issues with his blood pressure since.  He denies any headache, vision changes, trouble walking, trouble talking, chest pain, shortness of breath.  He reports he was told to come to the emerged department from the dentist because of his elevated blood pressure.  This was done with the wrist cuff.  Could be medical equipment failure/human error, nerves causing his elevated blood pressure.  I doubt any hypertensive urgency or emergency given the patient has asymptomatic and his blood pressure is barely above normal at 130/93.  His neurological Sam is grossly unremarkable.  He is stable for discharge home.  I given him a blood pressure log for him to keep his blood pressures at bring to his next primary care appointment.  I do not feel need to restart him on blood pressure medications given the findings in the ER today.  We discussed plan at bedside. We discussed strict return precautions and red flag symptoms. The patient verbalized their understanding and agrees to the plan. The patient is stable and being discharged home in good condition.  Portions of this report may have been transcribed using voice recognition software. Every effort was made to ensure accuracy; however, inadvertent computerized transcription errors may be present.    Final diagnoses:  Elevated blood pressure reading    ED Discharge Orders     None          Bernis Ernst, [place] 03/25/24 1507    Tegeler, Lonni PARAS, MD 03/30/24 [phone]

## 2024-03-25 NOTE — Discharge Instructions (Addendum)
 You were seen in the ER for evaluation of your elevated blood pressure. Your blood pressure here is barely abnormal. I am sending you home with a blood pressure log for you to take your blood pressure at home. Please make sure to bring it to your next PCP visit. Your blood pressure here is 130/93. The blood pressure at the clinic may have been elevated because of nerves, or inaccuracy of medical equipment which can happen with wrist BP cuffs. I have included more information into this paperwork for you to review. If you have any concerns, new or worsening symptoms, please return to the nearest ER for re-evaluation.   Contact a health care provider if you: Think you are having a reaction to a medicine you are taking. Have headaches that keep coming back (recurring). Feel dizzy. Have swelling in your ankles. Have trouble with your vision. Get help right away if you: Develop a severe headache or confusion. Have unusual weakness or numbness. Feel faint. Have severe pain in your chest or abdomen. Vomit repeatedly. Have trouble breathing. These symptoms may be an emergency. Get help right away. Call 911. Do not wait to see if the symptoms will go away. Do not drive yourself to the hospital.

## 2024-04-22 ENCOUNTER — Ambulatory Visit (INDEPENDENT_AMBULATORY_CARE_PROVIDER_SITE_OTHER): Admitting: Internal Medicine

## 2024-04-22 VITALS — BP 130/86 | HR 78 | Temp 98.2°F | Ht 71.0 in | Wt 241.0 lb

## 2024-04-22 DIAGNOSIS — G4730 Sleep apnea, unspecified: Secondary | ICD-10-CM

## 2024-04-22 DIAGNOSIS — E669 Obesity, unspecified: Secondary | ICD-10-CM | POA: Diagnosis not present

## 2024-04-22 DIAGNOSIS — I10 Essential (primary) hypertension: Secondary | ICD-10-CM

## 2024-04-22 DIAGNOSIS — N528 Other male erectile dysfunction: Secondary | ICD-10-CM

## 2024-04-22 DIAGNOSIS — Z01818 Encounter for other preprocedural examination: Secondary | ICD-10-CM

## 2024-04-22 LAB — COMPREHENSIVE METABOLIC PANEL WITH GFR
ALT: 21 U/L (ref 0–53)
AST: 18 U/L (ref 0–37)
Albumin: 4.8 g/dL (ref 3.5–5.2)
Alkaline Phosphatase: 69 U/L (ref 39–117)
BUN: 12 mg/dL (ref 6–23)
CO2: 27 meq/L (ref 19–32)
Calcium: 8.9 mg/dL (ref 8.4–10.5)
Chloride: 102 meq/L (ref 96–112)
Creatinine, Ser: 1.03 mg/dL (ref 0.40–1.50)
GFR: 87.56 mL/min (ref >60.00)
Glucose, Bld: 94 mg/dL (ref 70–99)
Potassium: 3.7 meq/L (ref 3.5–5.1)
Sodium: 139 meq/L (ref 135–145)
Total Bilirubin: 1.3 mg/dL — ABNORMAL HIGH (ref 0.2–1.2)
Total Protein: 7.2 g/dL (ref 6.0–8.3)

## 2024-04-22 LAB — CBC WITH DIFFERENTIAL/PLATELET
Basophils Absolute: 0 K/uL (ref 0.0–0.1)
Basophils Relative: 0.5 % (ref 0.0–3.0)
Eosinophils Absolute: 0 K/uL (ref 0.0–0.7)
Eosinophils Relative: 0.6 % (ref 0.0–5.0)
HCT: 42.8 % (ref 39.0–52.0)
Hemoglobin: 13.7 g/dL (ref 13.0–17.0)
Lymphocytes Relative: 50.5 % — ABNORMAL HIGH (ref 12.0–46.0)
Lymphs Abs: 3.1 K/uL (ref 0.7–4.0)
MCHC: 32.1 g/dL (ref 30.0–36.0)
MCV: 74.4 fl — ABNORMAL LOW (ref 78.0–100.0)
Monocytes Absolute: 0.5 K/uL (ref 0.1–1.0)
Monocytes Relative: 8.7 % (ref 3.0–12.0)
Neutro Abs: 2.4 K/uL (ref 1.4–7.7)
Neutrophils Relative %: 39.7 % — ABNORMAL LOW (ref 43.0–77.0)
Platelets: 286 K/uL (ref 150.0–400.0)
RBC: 5.75 Mil/uL (ref 4.22–5.81)
RDW: 17.1 % — ABNORMAL HIGH (ref 11.5–15.5)
WBC: 6.1 K/uL (ref 4.0–10.5)

## 2024-04-22 MED ORDER — SILDENAFIL CITRATE 100 MG PO TABS: 50.0000 mg | ORAL_TABLET | Freq: Every day | ORAL | 4 refills | Status: AC | PRN

## 2024-04-22 MED ORDER — SILDENAFIL CITRATE 100 MG PO TABS: 50.0000 mg | ORAL_TABLET | Freq: Every day | ORAL | 11 refills | Status: DC | PRN

## 2024-04-22 NOTE — Assessment & Plan Note (Signed)
 Assessment: Difficulty losing weight, likely exacerbated by untreated OSA. Plan:  Emphasize that OSA treatment often aids weight loss. Encourage continued physical activity and healthy lifestyle. Reassess weight management after OSA evaluation.

## 2024-04-22 NOTE — Patient Instructions (Signed)
 It was a pleasure seeing you today! Your health and satisfaction are our top priorities.  Bernardino Cone, MD  VISIT SUMMARY: You came in today for a preoperative evaluation for your upcoming wisdom tooth extraction. We discussed your concerns about your blood pressure and potential sleep apnea, and reviewed your current medications and overall health status.  YOUR PLAN: -PREOPERATIVE MEDICAL CLEARANCE FOR DENTAL EXTRACTION: You need medical clearance for your wisdom tooth extraction with anesthesia. Your blood pressure is slightly elevated but not a significant risk for surgery. You are physically active with no chest pain or shortness of breath, indicating low cardiovascular risk. We will order preoperative labs to check your kidney function and metabolic panel. Please inform the surgical team about your suspected sleep apnea and avoid alcohol with pain medication after surgery.  -HYPERTENSION: Hypertension means high blood pressure. Your current reading is 139/96. We will increase your amlodipine  to 10 mg daily to help manage your blood pressure. Please monitor your blood pressure at home. Suspected sleep apnea may be contributing to your high blood pressure, so we recommend a sleep study.  -SUSPECTED OBSTRUCTIVE SLEEP APNEA: Obstructive sleep apnea is a condition where your breathing stops and starts during sleep, which can affect your blood pressure and weight. We discussed how this might be contributing to your hypertension and difficulty losing weight. We recommend a home sleep test to confirm this condition. In the meantime, try rinsing your sinuses at night to improve sleep quality.  -OBESITY: Obesity means having excess body weight. This can be related to untreated sleep apnea, which increases stress hormones and makes it harder to lose weight. We discussed addressing sleep apnea as a potential factor in your weight management. Continue with physical activity and consider lifestyle  modifications.  INSTRUCTIONS: Please follow up with the recommended preoperative labs and inform the surgical team about your suspected sleep apnea. Monitor your blood pressure at home and consider a sleep study to evaluate for sleep apnea. Avoid alcohol consumption with pain medication after your surgery.  Your Providers PCP: Cone Bernardino MATSU, MD,  [phone]) Referring Provider: Cone Bernardino MATSU, MD,  [phone])  NEXT STEPS: [x]  Early Intervention: Schedule sooner appointment, call our on-call services, or go to emergency room if there is any significant Increase in pain or discomfort New or worsening symptoms Sudden or severe changes in your health [x]  Flexible Follow-Up: We recommend a No follow-ups on file. for optimal routine care. This allows for progress monitoring and treatment adjustments. [x]  Preventive Care: Schedule your annual preventive care visit! It's typically covered by insurance and helps identify potential health issues early. [x]  Lab & X-ray Appointments: Incomplete tests scheduled today, or call to schedule. X-rays: [facility] (M-F, 8:30am-noon or 1pm-5pm). [x]  Medical Information Release: Sign a release form at front desk to obtain relevant medical information we don't have.  MAKING THE MOST OF OUR FOCUSED 20 MINUTE APPOINTMENTS: [x]   Clearly state your top concerns at the beginning of the visit to focus our discussion [x]   If you anticipate you will need more time, please inform the front desk during scheduling - we can book multiple appointments in the same week. [x]   If you have transportation problems- use our convenient video appointments or ask about transportation support. [x]   We can get down to business faster if you use MyChart to update information before the visit and submit non-urgent questions before your visit. Thank you for taking the time to provide details through MyChart.  Let our nurse  know and she can import this information  into your encounter documents.  Arrival and Wait Times: [x]   Arriving on time ensures that everyone receives prompt attention. [x]   Early morning (8a) and afternoon (1p) appointments tend to have shortest wait times. [x]   Unfortunately, we cannot delay appointments for late arrivals or hold slots during phone calls.  Getting Answers and Following Up [x]   Simple Questions & Concerns: For quick questions or basic follow-up after your visit, reach us  at [phone] or MyChart messaging. [x]   Complex Concerns: If your concern is more complex, scheduling an appointment might be best. Discuss this with the staff to find the most suitable option. [x]   Lab & Imaging Results: We'll contact you directly if results are abnormal or you don't use MyChart. Most normal results will be on MyChart within 2-3 business days, with a review message from Dr. Jesus. Haven't heard back in 2 weeks? Need results sooner? Contact us  at [phone]. [x]   Referrals: Our referral coordinator will manage specialist referrals. The specialist's office should contact you within 2 weeks to schedule an appointment. Call us  if you haven't heard from them after 2 weeks.  Staying Connected [x]   MyChart: Activate your MyChart for the fastest way to access results and message us . See the last page of this paperwork for instructions on how to activate.  Bring to Your Next Appointment [x]   Medications: Please bring all your medication bottles to your next appointment to ensure we have an accurate record of your prescriptions. [x]   Health Diaries: If you're monitoring any health conditions at home, keeping a diary of your readings can be very helpful for discussions at your next appointment.  Billing [x]   X-ray & Lab Orders: These are billed by separate companies. Contact the invoicing company directly for questions or concerns. [x]   Visit Charges: Discuss any billing inquiries with our administrative services team.  Your  Satisfaction Matters [x]   Share Your Experience: We strive for your satisfaction! If you have any complaints, or preferably compliments, please let Dr. Jesus know directly or contact our Practice Administrators, Manuelita Rubin or [employer], by asking at the front desk.   Reviewing Your Records [x]   Review this early draft of your clinical encounter notes below and the final encounter summary tomorrow on MyChart after its been completed.  All orders placed so far are visible here: Preoperative examination -     Home sleep test  Sleep apnea, unspecified type -     Home sleep test  Obesity (BMI 30-39.9) -     Home sleep test  Primary hypertension -     Home sleep test -     Comprehensive metabolic panel with GFR -     CBC with Differential/Platelet  Other male erectile dysfunction -     Sildenafil  Citrate; Take 0.5-1 tablets (50-100 mg total) by mouth daily as needed for erectile dysfunction.  Dispense: 90 tablet; Refill: 4

## 2024-04-22 NOTE — Progress Notes (Signed)
 Patient Care Team: Russell Russell MATSU, MD as PCP - General (Internal Medicine) Patient:  Russell Dawson [facility] Today's Healthcare Provider: Bernardino Dawson Jesus, MD   Chief Complaint:  Requests consultative surgical examination, consent, and appropriate evaluation at the request of  patient, for planned oral-maxillofacial surgery specialist(s) for wisdom tooth extraction.  He has concern(s) about blood pressure and never went under anesthesia before.  Assessment/Plan:    Assessment & Plan Preoperative examination Assessment: Cleared for dental extraction under anesthesia. Blood pressure is mildly elevated but not a significant surgical risk. Functional status is excellent, and there are no cardiac symptoms. Mallampati class III airway suggests possible intubation difficulty, but this is mitigated by modern anesthesia techniques. Suspected OSA is noted as a potential perioperative risk. Plan:  Cleared for surgery from a medical perspective. Order preoperative labs: CBC and comprehensive metabolic panel (including kidney and liver function). Advise patient to inform the anesthesia team of suspected sleep apnea. Counsel to avoid all alcohol while taking postoperative pain medications due to increased risk of respiratory depression, especially with OSA. Provide reassurance regarding overall low perioperative risk. Sleep apnea, unspecified type Assessment: High suspicion given hypertension, obesity, chronic sinus congestion, and ED. Plan:  Order home sleep study to confirm diagnosis. Encourage nightly saline sinus rinses to improve nasal airflow and sleep quality. Educate on OSA's impact on blood pressure, weight, and overall health. Discuss alternative OSA treatments (CPAP, oral appliance, injectable weight loss therapy, surgical implant) if confirmed. Obesity (BMI 30-39.9) Assessment: Difficulty losing weight, likely exacerbated by untreated OSA. Plan:  Emphasize that OSA treatment often aids  weight loss. Encourage continued physical activity and healthy lifestyle. Reassess weight management after OSA evaluation. Primary hypertension Assessment: BP 139/96 mmHg on amlodipine  5 mg; likely multifactorial, with suspected OSA contributing. Plan:  Increase amlodipine  to 10 mg daily. Instruct patient to monitor blood pressure at home; follow-up if consistently >140/90 mmHg. NSAIDs can worsen BP but are not being used regularly. Consider adding a second antihypertensive (e.g., losartan) if BP remains elevated after OSA evaluation/treatment. Other male erectile dysfunction Assessment: Likely multifactorial, related to hypertension and possible OSA. Plan:  Prescribe sildenafil  100 mg, 90-day supply. Counsel that treating OSA and hypertension may improve ED.  Labs Ordered: CBC Comprehensive metabolic panel Referrals/Orders: Home sleep study Medications: Amlodipine  increased to 10 mg daily Sildenafil  100 mg, 90 tablets  Patient is cleared for planned dental extraction under anesthesia, with above precautions and follow-up plans in place.     Subjective:  HPI  Discussed the use of AI scribe software for clinical note transcription with the patient, who gave verbal consent to proceed.  History of Present Illness 48 year old male with hypertension who presents for preoperative evaluation for wisdom tooth extraction.  He is seeking clearance for anesthesia prior to his upcoming wisdom tooth extraction. He has no known history of heart conditions or issues with anesthesia. He is concerned about his blood pressure, which has been high recently. He is currently taking amlodipine  5 mg for hypertension. No chest pain or shortness of breath.  He has not been diagnosed with sleep apnea and has not had a sleep study. He does not recall anyone telling him that he stops breathing during sleep. He experiences chronic sinus congestion but does not regularly rinse his sinuses. No smoking or use  of nicotine products.  He is not currently experiencing any significant symptoms that would interfere with his daily activities, such as chest pain or shortness of breath. He is able to perform physical  activities like walking, mowing the lawn, and running a quarter mile without difficulty.  He is concerned about his weight, which he finds difficult to manage, and he is dating someone who enjoys alcohol, although he does not drink much himself.   The higher the DASI score, the more physically active the patient is. Patients who can achieve <4 METs have poor functional capacity, 4 to 10 METs suggest moderate functional capacity, and >10 METs suggest excellent functional capacity. The DASI questionnaire is not designed to assess very high levels of physical activity. The maximum DASI score is 58.2, which would be the equivalent of 9.89 METs.  Its controversial but many clinicians use a cutoff DASI of less than 25-34 as a marker of increased surgical risk.      Curator of Surgeons Surgical Risk (ACS NSQIP) This calculator is best for calculating surgery specific risk - High risk - >5 percent - Intermediate risk - >=1 to <=5 percent - Low risk - <1 percent    MICA Russell Dawson) perioperative risk  RCRI Calculated Risk  ROS   unless noted otherwise, a complete review of systems was negative.    Past Medical History:  Diagnosis Date   AKI (acute kidney injury) (HCC) 01/26/2023   Anxiety    Cigarette smoker 01/26/2023   Dry mouth 01/21/2023   Severe dry throat upon aweakening Seems due to chronic severe nasal obstructions while sleeping    Elevated troponin 01/26/2023   Erectile dysfunction 01/21/2023   Has health insurance with inadequate coverage of health expenses 10/03/2022   He is on a high deductible health savings account so that he would have to pay the entire cost of lab work to do additional labs for his problem of elevated thyroid  microcytosis   History of rhabdomyolysis  01/26/2023   Lab Results  Component  Value  Date     CKTOTAL  309 (H)  03/05/2023     CKTOTAL  2,703 (H)  01/29/2023     CKTOTAL  3,743 (H)  01/28/2023     CKTOTAL  3,653 (H)  01/28/2023     CKTOTAL  4,314 (H)  01/27/2023  Staying hydrated and avoiding muscle injury is important      Hypertension 03/14/2023   Left-sided chest pain 01/27/2023   Liver injury 03/05/2023   Mixed hyperlipidemia 10/03/2022   Tg over 500    Non-traumatic rhabdomyolysis 01/27/2023   Precordial chest pain 01/26/2023   Precordial pain 01/26/2023   Synovitis of hip 06/20/2021   Tachycardia 01/26/2023   Patient Active Problem List   Diagnosis Date Noted   Pain in left hip 10/17/2023   Beta thalassemia trait 03/16/2023   Healthcare maintenance 03/16/2023   Hypertension 03/14/2023   Prediabetes 01/26/2023   Low testosterone  01/21/2023   Erectile dysfunction 01/21/2023   Subclinical hypothyroidism 10/03/2022   Hyperlipidemia 10/03/2022   History of smoking 10/03/2022   Obesity (BMI 30-39.9) 10/03/2022   RBC microcytosis 10/03/2022   Eustachian tube dysfunction, bilateral 10/03/2022   Has health insurance with inadequate coverage of health expenses 10/03/2022   Sleep apnea 10/03/2022   History reviewed. No pertinent surgical history.  Family History  Problem Relation Age of Onset   Heart failure Mother    Diabetes Mother    Tuberculosis Mother    Dementia Father     Medications were reconciled and perioperative medicine management was discussed Current Outpatient Medications  Medication Sig Dispense Refill   ibuprofen  (ADVIL ) 800 MG tablet Take 800 mg by mouth every  6 (six) hours as needed.     predniSONE  (DELTASONE ) 10 MG tablet Take 3 tablets (30 mg total) by mouth daily with breakfast. (Patient not taking: Reported on 04/22/2024) 15 tablet 0   No current facility-administered medications for this visit.    Allergies-reviewed and updated Allergies  Allergen Reactions   Tape Swelling    States  allergic to apoxy    Social History   Socioeconomic History   Marital status: Single    Spouse name: Not on file   Number of children: Not on file   Years of education: Not on file   Highest education level: Bachelor's degree (e.g., BA, AB, BS)  Occupational History   Not on file  Tobacco Use   Smoking status: Former    Current packs/day: 0.00    Types: Cigarettes    Quit date: 04/03/2014    Years since quitting: 10.0   Smokeless tobacco: Never  Vaping Use   Vaping status: Never Used  Substance and Sexual Activity   Alcohol use: Not Currently   Drug use: Never   Sexual activity: Yes    Birth control/protection: None  Other Topics Concern   Not on file  Social History Narrative   Not on file   Social Drivers of Health   Financial Resource Strain: Low Risk  (04/22/2024)   Overall Financial Resource Strain (CARDIA)    Difficulty of Paying Living Expenses: Not very hard  Food Insecurity: No Food Insecurity (04/22/2024)   Hunger Vital Sign    Worried About Running Out of Food in the Last Year: Never true    Ran Out of Food in the Last Year: Never true  Transportation Needs: No Transportation Needs (04/22/2024)   PRAPARE - Administrator, Civil Service (Medical): No    Lack of Transportation (Non-Medical): No  Physical Activity: Insufficiently Active (04/22/2024)   Exercise Vital Sign    Days of Exercise per Week: 2 days    Minutes of Exercise per Session: 30 min  Stress: No Stress Concern Present (04/22/2024)   [employer] of Occupational Health - Occupational Stress Questionnaire    Feeling of Stress: Not at all  Social Connections: Moderately Integrated (04/22/2024)   Social Connection and Isolation Panel    Frequency of Communication with Friends and Family: Never    Frequency of Social Gatherings with Friends and Family: Three times a week    Attends Religious Services: 1 to 4 times per year    Active Member of Clubs or Organizations: Yes    Attends  Banker Meetings: 1 to 4 times per year    Marital Status: Never married         Objective:  Physical Exam:  I. General Appearance:      General: Well-developed, well-nourished, in no acute distress.      Level of Consciousness: Alert and oriented to person, place, and time.     BP (!) 136/96   Pulse 78   Temp 98.2 F (36.8 C) (Temporal)   Ht 5' 11 (1.803 m)   Wt 241 lb (109.3 kg)   SpO2 98%   BMI 33.61 kg/m        Wt Readings from Last 3 Encounters:  04/22/24 241 lb (109.3 kg)  03/25/24 236 lb (107 kg)  10/14/23 242 lb 3.2 oz (109.9 kg)        BP Readings from Last 3 Encounters:  04/22/24 (!) 136/96  03/25/24 (!) 130/93  10/14/23 130/88  Clinical Frailty Scale: Very Fit: People who are robust, active, energetic, and motivated. These people commonly exercise regularly. They are among the fittest for their age.  II. Head, Eyes, Ears, Nose, and Throat (HEENT):      Head: Normocephalic, atraumatic (NCAT).     Eyes:         Pupils: Equal, round, reactive to light and accommodation (PERRLA).         Sclerae: Non-icteric.         Conjunctivae: Pink, no injection.     III (soft and hard palate and base of uvula visible)     Any conditions that might make intubation difficult (e.g., limited neck mobility, prominent overbite): no     Nose: Nasal mucosa moist, no discharge.     Throat: Oropharynx moist, no erythema or exudates.  III. Cardiovascular (CV):      Heart Rate and Rhythm: Regular rate and rhythm.  Pulse Readings from Last 1 Encounters:  04/22/24 78       Heart Sounds: No murmurs, rubs, or gallops. Specify S1 and S2 present.     Peripheral Pulses: Palpable and equal bilaterally (document location, e.g., radial, dorsalis pedis). If pulses are diminished or absent, document that as well.     Capillary Refill: <2 seconds.  IV. Respiratory:      Lungs: Clear to auscultation bilaterally. No wheezes, rales (crackles), or rhonchi.   SpO2 Readings  from Last 1 Encounters:  04/22/24 98%        Respiratory Effort:  No use of accessory muscles, no retractions .Normal chest wall movement .  V. Abdomen:      Abdomen: Soft, non-tender, non-distended.     Bowel Sounds: Present in all four quadrants.     No guarding, rigidity, or rebound tenderness. (If present, specify location and severity.)  VI. Musculoskeletal:      Extremities: No edema, cyanosis, or clubbing.     Range of Motion:  normal     Skin: Warm, dry, intact. No rashes, lesions, or ulcers of concern near planned surgical site.     VII. Neurologic (Neuro):      Mental Status: Alert and oriented      Cranial Nerves: Grossly intact.     Motor:  No focal or generalized weakness apparent     Gait: (If assessed) Steady gait.  VIII. Psychiatric (Psych):      Mood and Affect: Appropriate.     Thought Process and Content: Normal. Demonstrates understanding.

## 2024-04-22 NOTE — Assessment & Plan Note (Signed)
 Assessment: High suspicion given hypertension, obesity, chronic sinus congestion, and ED. Plan:  Order home sleep study to confirm diagnosis. Encourage nightly saline sinus rinses to improve nasal airflow and sleep quality. Educate on OSA's impact on blood pressure, weight, and overall health. Discuss alternative OSA treatments (CPAP, oral appliance, injectable weight loss therapy, surgical implant) if confirmed.

## 2024-04-22 NOTE — Assessment & Plan Note (Signed)
 Assessment: BP 139/96 mmHg on amlodipine  5 mg; likely multifactorial, with suspected OSA contributing. Plan:  Increase amlodipine  to 10 mg daily. Instruct patient to monitor blood pressure at home; follow-up if consistently >140/90 mmHg. NSAIDs can worsen BP but are not being used regularly. Consider adding a second antihypertensive (e.g., losartan) if BP remains elevated after OSA evaluation/treatment.

## 2024-04-22 NOTE — Assessment & Plan Note (Signed)
 Assessment: Likely multifactorial, related to hypertension and possible OSA. Plan:  Prescribe sildenafil  100 mg, 90-day supply. Counsel that treating OSA and hypertension may improve ED.

## 2024-04-23 ENCOUNTER — Ambulatory Visit: Payer: Self-pay | Admitting: Internal Medicine

## 2024-04-23 ENCOUNTER — Encounter: Payer: Self-pay | Admitting: Internal Medicine

## 2024-04-23 NOTE — Progress Notes (Signed)
 Sent the patient a message after reviewing his labs

## 2024-04-24 ENCOUNTER — Telehealth: Payer: Self-pay

## 2024-04-24 ENCOUNTER — Other Ambulatory Visit: Payer: Self-pay

## 2024-04-24 MED ORDER — AMLODIPINE BESYLATE 10 MG PO TABS: 10.0000 mg | ORAL_TABLET | Freq: Every day | ORAL | 1 refills | Status: AC

## 2024-04-24 NOTE — Telephone Encounter (Signed)
 Copied from CRM [id number]. Topic: Clinical - Prescription Issue >> Apr 24, 2024  1:52 PM Abigail D wrote: Reason for CRM: Patient is calling because he was prescribed amLODipine  (NORVASC ) 10 MG tablet at his visit from 7/30 but has not received the medication yet. He would like this sent to the [facility] on file.  Medication has been sent to [facility] on file pharmacy should let pt know when ready

## 2024-07-27 ENCOUNTER — Encounter: Payer: Self-pay | Admitting: Radiology

## 2024-09-14 ENCOUNTER — Other Ambulatory Visit: Payer: Self-pay

## 2024-09-14 ENCOUNTER — Ambulatory Visit

## 2024-09-14 ENCOUNTER — Ambulatory Visit
Admission: RE | Admit: 2024-09-14 | Discharge: 2024-09-14 | Disposition: A | Discharge status: Home / Self Care | Source: Ambulatory Visit | Attending: Nurse Practitioner | Admitting: Nurse Practitioner

## 2024-09-14 VITALS — BP 133/92 | Temp 98.4°F | Resp 18

## 2024-09-14 DIAGNOSIS — S93401A Sprain of unspecified ligament of right ankle, initial encounter: Secondary | ICD-10-CM

## 2024-09-14 DIAGNOSIS — Z113 Encounter for screening for infections with a predominantly sexual mode of transmission: Secondary | ICD-10-CM | POA: Insufficient documentation

## 2024-09-14 MED ORDER — IBUPROFEN 800 MG PO TABS: 800.0000 mg | ORAL_TABLET | Freq: Three times a day (TID) | ORAL | 0 refills | Status: AC | PRN

## 2024-09-14 MED ORDER — IBUPROFEN 800 MG PO TABS
800.0000 mg | ORAL_TABLET | Freq: Once | ORAL | Status: AC
  Administered 2024-09-14: 800 mg via ORAL

## 2024-09-14 NOTE — ED Provider Notes (Addendum)
 " [facility]    CSN: [id number] Arrival date & time: 09/14/24  9046      History   Chief Complaint Chief Complaint  Patient presents with   Appointment    1000   Ankle Pain    HPI Russell Dawson is a 48 y.o. male.   Discussed the use of AI scribe software for clinical note transcription with the patient, who gave verbal consent to proceed.   Patient presents with right ankle pain following an injury that occurred while dancing on a dance floor in [place] 6 days ago. The patient twisted their ankle during this activity. The ankle is noted to have visible swelling. The patient is able to walk on the affected ankle but with pain. The patient denies numbness or tingling in the feet. The patient has not taken any medications or implemented any treatments for the injury prior to this visit.  The following sections of the patient's history were reviewed and updated as appropriate: allergies, current medications, past family history, past medical history, past social history, past surgical history, and problem list.        Past Medical History:  Diagnosis Date   AKI (acute kidney injury) 01/26/2023   Anxiety    Cigarette smoker 01/26/2023   Dry mouth 01/21/2023   Severe dry throat upon aweakening Seems due to chronic severe nasal obstructions while sleeping    Elevated troponin 01/26/2023   Erectile dysfunction 01/21/2023   Has health insurance with inadequate coverage of health expenses 10/03/2022   He is on a high deductible health savings account so that he would have to pay the entire cost of lab work to do additional labs for his problem of elevated thyroid  microcytosis   History of rhabdomyolysis 01/26/2023   Lab Results  Component  Value  Date     CKTOTAL  309 (H)  03/05/2023     CKTOTAL  2,703 (H)  01/29/2023     CKTOTAL  3,743 (H)  01/28/2023     CKTOTAL  3,653 (H)  01/28/2023     CKTOTAL  4,314 (H)  01/27/2023  Staying hydrated and avoiding muscle  injury is important      Hypertension 03/14/2023   Left-sided chest pain 01/27/2023   Liver injury 03/05/2023   Mixed hyperlipidemia 10/03/2022   Tg over 500    Non-traumatic rhabdomyolysis 01/27/2023   Precordial chest pain 01/26/2023   Precordial pain 01/26/2023   Synovitis of hip 06/20/2021   Tachycardia 01/26/2023    Patient Active Problem List   Diagnosis Date Noted   Pain in left hip 10/17/2023   Healthcare maintenance 03/16/2023   Hypertension 03/14/2023   Prediabetes 01/26/2023   Current every day smoker 01/26/2023   Low testosterone  01/21/2023   Erectile dysfunction 01/21/2023   Subclinical hypothyroidism 10/03/2022   Hyperlipidemia 10/03/2022   History of smoking 10/03/2022   Obesity (BMI 30-39.9) 10/03/2022   Eustachian tube dysfunction, bilateral 10/03/2022   Has health insurance with inadequate coverage of health expenses 10/03/2022   Sleep apnea 10/03/2022   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 No pertinent surgical history.     Home Medications    Prior to Admission medications  Medication Sig Start Date End Date Taking? Authorizing Provider  ibuprofen  (ADVIL ) 800 MG tablet Take 1 tablet (800 mg total) by mouth every 8 (eight) hours as needed (pain). Do not take any additional over-the-counter NSAIDs (such as Advil , Aleve, or other ibuprofen /naproxen products) while using this medication.  09/14/24  Yes Jamarian Jacinto, FNP  amLODipine  (NORVASC ) 10 MG tablet Take 1 tablet (10 mg total) by mouth daily. 04/24/24   [name], MD  sildenafil  (VIAGRA ) 100 MG tablet Take 0.5-1 tablets (50-100 mg total) by mouth daily as needed for erectile dysfunction. 04/22/24   [name], MD    Family History Family History  Problem Relation Age of Onset   Heart failure Mother    Diabetes Mother    Tuberculosis Mother    Dementia Father     Social History Social History[1]   Allergies   Tape   Review of Systems Review of Systems   Musculoskeletal:  Positive for arthralgias (left ankle), gait problem and joint swelling.  All other systems reviewed and are negative.    Physical Exam Triage Vital Signs ED Triage Vitals  Encounter Vitals Group     BP 09/14/24 1009 (!) 133/92     Girls Systolic BP Percentile --      Girls Diastolic BP Percentile --      Boys Systolic BP Percentile --      Boys Diastolic BP Percentile --      Pulse --      Resp 09/14/24 1009 18     Temp 09/14/24 1009 98.4 F (36.9 C)     Temp Source 09/14/24 1009 Oral     SpO2 09/14/24 1009 97 %     Weight --      Height --      Head Circumference --      Peak Flow --      Pain Score 09/14/24 1007 4     Pain Loc --      Pain Education --      Exclude from Growth Chart --    No data found.  Updated Vital Signs BP (!) 133/92 (BP Location: Right Arm) Comment: patient takes medicine at night/works night shift Comment (BP Location): large cuff  Temp 98.4 F (36.9 C) (Oral)   Resp 18   SpO2 97%   Visual Acuity Right Eye Distance:   Left Eye Distance:   Bilateral Distance:    Right Eye Near:   Left Eye Near:    Bilateral Near:     Physical Exam Vitals reviewed.  Constitutional:      General: He is awake. He is not in acute distress.    Appearance: Normal appearance. He is well-developed. He is not ill-appearing, toxic-appearing or diaphoretic.  HENT:     Head: Normocephalic.     Right Ear: Hearing normal.     Left Ear: Hearing normal.     Nose: Nose normal.     Mouth/Throat:     Mouth: Mucous membranes are moist.  Eyes:     General: Vision grossly intact.     Conjunctiva/sclera: Conjunctivae normal.  Cardiovascular:     Rate and Rhythm: Normal rate and regular rhythm.     Pulses:          Dorsalis pedis pulses are 2+ on the left side.     Heart sounds: Normal heart sounds.  Pulmonary:     Effort: Pulmonary effort is normal.     Breath sounds: Normal breath sounds and air entry.  Abdominal:     Palpations: Abdomen is  soft.  Genitourinary:    Comments: Deferred; patient performed self-swab for Aptima testing  Musculoskeletal:        General: Normal range of motion.     Cervical back: Normal range of  motion and neck supple.     Left lower leg: No swelling or tenderness.     Left ankle: Swelling present. No deformity, ecchymosis or lacerations. Tenderness present over the lateral malleolus. Normal range of motion.     Left Achilles Tendon: Normal.     Left foot: Normal. Normal range of motion. No deformity.     Comments: Tenderness and mild swelling are present along the lateral aspect of the left ankle. Strength, sensation, and range of motion of the foot, ankle, and toes are intact. There is no obvious deformity or joint effusion. Capillary refill is normal, and the extremity is neurovascularly intact.   Feet:     Left foot:     Skin integrity: Skin integrity normal.  Skin:    General: Skin is warm and dry.  Neurological:     General: No focal deficit present.     Mental Status: He is alert and oriented to person, place, and time.  Psychiatric:        Mood and Affect: Mood normal.        Speech: Speech normal.        Behavior: Behavior normal. Behavior is cooperative.      UC Treatments / Results  Labs (all labs ordered are listed, but only abnormal results are displayed) Labs Reviewed - No data to display  EKG   Radiology No results found.  Procedures Procedures (including critical care time)  Medications Ordered in UC Medications  ibuprofen  (ADVIL ) tablet 800 mg (800 mg Oral Given 09/14/24 1045)    Initial Impression / Assessment and Plan / UC Course  I have reviewed the triage vital signs and the nursing notes.  Pertinent labs & imaging results that were available during my care of the patient were reviewed by me and considered in my medical decision making (see chart for details).     The patient presents with right ankle pain. Examination revealed pain and swelling to the  lateral aspect of the ankle without focal deficits or neurovascular compromise. X-ray has been ordered and results are pending. Patient will be notified if imaging reveals any abnormal findings; otherwise, results may be reviewed through MyChart. Findings are most consistent with a soft tissue injury. Supportive management with rest, ice and elevation was advised. Walker boot provided to help provide stable immobilization while still allowing safe weight bearing. He can remove for shower and sleep. Motrin  was given in clinic and also prescribed for pain and inflammation. While taking the medication, patient advised to not use over-the-counter anti-inflammatories such as aspirin , Motrin , ibuprofen , or Aleve, as this may increase the risk of side effects. If needed, patient may take Tylenol  for additional pain relief. The patient was instructed to follow up with orthopedics if symptoms do not improve within 10 days or sooner if pain or swelling worsens, or if new concerning symptoms develop.   Additionally, the patient requested sexually transmitted infection screening after learning that testing was available at urgent care. He denies any current symptoms and states he would like to be checked before the new year. Urethral cytology was obtained for trichomonas, gonorrhea, and chlamydia. Blood testing for HIV and RPR was also collected.  Today's evaluation has revealed no signs of a dangerous process. Discussed diagnosis with patient and/or guardian. Patient and/or guardian aware of their diagnosis, possible red flag symptoms to watch out for and need for close follow up. Patient and/or guardian understands verbal and written discharge instructions. Patient and/or guardian comfortable with plan and  disposition.  Patient and/or guardian has a clear mental status at this time, good insight into illness (after discussion and teaching) and has clear judgment to make decisions regarding their care  Documentation  was completed with the aid of voice recognition software. Transcription may contain typographical errors.  Final Clinical Impressions(s) / UC Diagnoses   Final diagnoses:  Sprain of right ankle, unspecified ligament, initial encounter     Discharge Instructions      You were seen today for right ankle pain and swelling. Your exam showed tenderness and swelling along the outside of the ankle without signs of nerve or blood vessel injury. An X-ray was obtained and is pending review by radiology. You will be contacted if any abnormalities are identified, and you may also review the results through MyChart. Based on todays findings, your symptoms are most consistent with a soft tissue injury such as a sprain.  Rest the ankle as much as possible and keep it elevated when you are sitting or lying down to help reduce swelling. Apply ice for 15-20 minutes at a time several times a day. A walker boot was provided to support and stabilize the ankle while allowing safe weight bearing. You may remove the boot for sleeping and showering. Motrin  was given in clinic and prescribed to help with pain and inflammation. While taking this medication, do not use other anti-inflammatory medicines such as aspirin , ibuprofen , Motrin , or Aleve, as this may increase the risk of side effects. You may use Tylenol  if additional pain relief is needed.  Follow up with an orthopedic specialist if your symptoms do not improve within 10 days, if pain or swelling worsens, or if you develop new concerns. Seek emergency care right away if you experience severe or increasing pain, numbness or tingling, weakness, color or temperature changes in the foot, inability to bear weight, or any other concerning symptoms.     ED Prescriptions     Medication Sig Dispense Auth. Provider   ibuprofen  (ADVIL ) 800 MG tablet Take 1 tablet (800 mg total) by mouth every 8 (eight) hours as needed (pain). Do not take any additional over-the-counter  NSAIDs (such as Advil , Aleve, or other ibuprofen /naproxen products) while using this medication. 21 tablet Iola Lukes, FNP      PDMP not reviewed this encounter.    Iola Lukes, FNP 09/14/24 1101    Iola Lukes, [place] 09/14/24 1102     [1]  Social History Tobacco Use   Smoking status: Former    Current packs/day: 0.00    Types: Cigarettes    Quit date: 04/03/2014    Years since quitting: 10.4   Smokeless tobacco: Never  Vaping Use   Vaping status: Never Used  Substance Use Topics   Alcohol use: Yes   Drug use: Never     Iola Lukes, FNP 09/14/24 1153  "

## 2024-09-14 NOTE — ED Notes (Signed)
 RN went to discharge patient with CAM boot. Patient requested STI screening to include penile swab and blood work. Lucie, FNP notified and will place additional orders.

## 2024-09-14 NOTE — ED Triage Notes (Signed)
 Right ankle pain .  Reports twisted ankle last Tuesday.  Patient notes some discomfort with walking, but able to walk without a limp.  Patient concerned for extreme swelling.    Has taken ibuprofen 

## 2024-09-14 NOTE — Discharge Instructions (Signed)
 You were seen today for right ankle pain and swelling. Your exam showed tenderness and swelling along the outside of the ankle without signs of nerve or blood vessel injury. An X-ray was obtained and is pending review by radiology. You will be contacted if any abnormalities are identified, and you may also review the results through MyChart. Based on todays findings, your symptoms are most consistent with a soft tissue injury such as a sprain.  Rest the ankle as much as possible and keep it elevated when you are sitting or lying down to help reduce swelling. Apply ice for 15-20 minutes at a time several times a day. A walker boot was provided to support and stabilize the ankle while allowing safe weight bearing. You may remove the boot for sleeping and showering. Motrin  was given in clinic and prescribed to help with pain and inflammation. While taking this medication, do not use other anti-inflammatory medicines such as aspirin , ibuprofen , Motrin , or Aleve, as this may increase the risk of side effects. You may use Tylenol  if additional pain relief is needed.  Follow up with an orthopedic specialist if your symptoms do not improve within 10 days, if pain or swelling worsens, or if you develop new concerns. Seek emergency care right away if you experience severe or increasing pain, numbness or tingling, weakness, color or temperature changes in the foot, inability to bear weight, or any other concerning symptoms.

## 2024-09-15 LAB — CYTOLOGY, (ORAL, ANAL, URETHRAL) ANCILLARY ONLY
Chlamydia: NEGATIVE
Comment: NEGATIVE
Comment: NEGATIVE
Comment: NORMAL
Neisseria Gonorrhea: NEGATIVE
Trichomonas: NEGATIVE

## 2024-09-15 LAB — HIV ANTIBODY (ROUTINE TESTING W REFLEX): HIV Screen 4th Generation wRfx: NONREACTIVE

## 2024-09-15 LAB — SYPHILIS: RPR W/REFLEX TO RPR TITER AND TREPONEMAL ANTIBODIES, TRADITIONAL SCREENING AND DIAGNOSIS ALGORITHM: RPR Ser Ql: NONREACTIVE

## 2024-10-16 ENCOUNTER — Telehealth: Payer: Self-pay | Admitting: Internal Medicine

## 2024-10-16 NOTE — Telephone Encounter (Signed)
 Lvm to r/s due to weather, appt is on 10/20/24. Thanks aw.

## 2024-10-20 ENCOUNTER — Encounter: Admitting: Internal Medicine

## 2024-10-27 ENCOUNTER — Encounter: Admitting: Internal Medicine

## 2024-11-02 ENCOUNTER — Encounter: Admitting: Internal Medicine

## 2024-11-23 ENCOUNTER — Encounter: Admitting: Internal Medicine
